# Patient Record
Sex: Female | Born: 1980 | Race: White | Hispanic: No | Marital: Married | Smoking: Never smoker
Health system: Southern US, Community
[De-identification: ages and names within clinical notes are randomized; demographics above are authoritative.]

## PROBLEM LIST (undated history)

## (undated) ENCOUNTER — Emergency Department: Admission: EM | Discharge status: Absent without leave | Payer: Self-pay

## (undated) DIAGNOSIS — I319 Disease of pericardium, unspecified: Secondary | ICD-10-CM

## (undated) DIAGNOSIS — R87629 Unspecified abnormal cytological findings in specimens from vagina: Secondary | ICD-10-CM

## (undated) DIAGNOSIS — I10 Essential (primary) hypertension: Secondary | ICD-10-CM

## (undated) DIAGNOSIS — K589 Irritable bowel syndrome without diarrhea: Secondary | ICD-10-CM

## (undated) DIAGNOSIS — T148XXA Other injury of unspecified body region, initial encounter: Secondary | ICD-10-CM

## (undated) HISTORY — PX: COLPOSCOPY: SHX161

## (undated) HISTORY — DX: Essential (primary) hypertension: I10

## (undated) HISTORY — DX: Other injury of unspecified body region, initial encounter: T14.8XXA

## (undated) HISTORY — DX: Unspecified abnormal cytological findings in specimens from vagina: R87.629

## (undated) HISTORY — DX: Irritable bowel syndrome, unspecified: K58.9

---
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
## 2002-03-27 HISTORY — PX: TONSILLECTOMY: SUR1361

## 2004-12-22 ENCOUNTER — Encounter: Payer: Self-pay | Admitting: Family Medicine

## 2004-12-22 LAB — CONVERTED CEMR LAB: Pap Smear: NORMAL

## 2005-12-28 ENCOUNTER — Ambulatory Visit: Payer: Self-pay | Admitting: Family Medicine

## 2006-01-15 ENCOUNTER — Encounter: Payer: Self-pay | Admitting: Family Medicine

## 2006-01-15 DIAGNOSIS — E669 Obesity, unspecified: Secondary | ICD-10-CM | POA: Insufficient documentation

## 2006-02-14 ENCOUNTER — Ambulatory Visit: Payer: Self-pay | Admitting: Family Medicine

## 2006-02-14 ENCOUNTER — Other Ambulatory Visit: Admission: RE | Admit: 2006-02-14 | Discharge: 2006-02-14 | Payer: Self-pay | Admitting: Family Medicine

## 2006-02-21 ENCOUNTER — Telehealth: Payer: Self-pay | Admitting: Family Medicine

## 2006-03-21 ENCOUNTER — Telehealth: Payer: Self-pay | Admitting: Family Medicine

## 2006-06-01 ENCOUNTER — Ambulatory Visit: Payer: Self-pay | Admitting: Family Medicine

## 2006-06-01 DIAGNOSIS — M545 Low back pain, unspecified: Secondary | ICD-10-CM | POA: Insufficient documentation

## 2006-06-04 ENCOUNTER — Ambulatory Visit: Payer: Self-pay | Admitting: Family Medicine

## 2006-06-04 DIAGNOSIS — J029 Acute pharyngitis, unspecified: Secondary | ICD-10-CM | POA: Insufficient documentation

## 2006-06-04 LAB — CONVERTED CEMR LAB: Rapid Strep: NEGATIVE

## 2006-07-12 ENCOUNTER — Ambulatory Visit: Payer: Self-pay | Admitting: Family Medicine

## 2006-07-12 DIAGNOSIS — R197 Diarrhea, unspecified: Secondary | ICD-10-CM | POA: Insufficient documentation

## 2006-08-09 ENCOUNTER — Ambulatory Visit: Payer: Self-pay | Admitting: Family Medicine

## 2006-08-10 LAB — CONVERTED CEMR LAB
ALT: 15 units/L (ref 0–35)
AST: 20 units/L (ref 0–37)
Albumin: 4.1 g/dL (ref 3.5–5.2)
Alkaline Phosphatase: 79 units/L (ref 39–117)
BUN: 11 mg/dL (ref 6–23)
CO2: 22 meq/L (ref 19–32)
Calcium: 9.2 mg/dL (ref 8.4–10.5)
Chloride: 103 meq/L (ref 96–112)
Creatinine, Ser: 0.76 mg/dL (ref 0.40–1.20)
Glucose, Bld: 86 mg/dL (ref 70–99)
HCT: 41.5 % (ref 36.0–46.0)
Hemoglobin: 13.6 g/dL (ref 12.0–15.0)
MCHC: 32.8 g/dL (ref 30.0–36.0)
MCV: 88.1 fL (ref 78.0–100.0)
Platelets: 380 10*3/uL (ref 150–400)
Potassium: 3.8 meq/L (ref 3.5–5.3)
RBC: 4.71 M/uL (ref 3.87–5.11)
RDW: 14 % (ref 11.5–14.0)
Sodium: 139 meq/L (ref 135–145)
TSH: 1.443 microintl units/mL (ref 0.350–5.50)
Total Bilirubin: 0.3 mg/dL (ref 0.3–1.2)
Total Protein: 7.7 g/dL (ref 6.0–8.3)
WBC: 6.8 10*3/uL (ref 4.0–10.5)

## 2006-08-13 ENCOUNTER — Encounter: Payer: Self-pay | Admitting: Family Medicine

## 2006-11-16 ENCOUNTER — Encounter: Payer: Self-pay | Admitting: Family Medicine

## 2006-12-14 ENCOUNTER — Encounter: Payer: Self-pay | Admitting: Family Medicine

## 2007-03-28 DIAGNOSIS — I319 Disease of pericardium, unspecified: Secondary | ICD-10-CM

## 2007-03-28 HISTORY — DX: Disease of pericardium, unspecified: I31.9

## 2007-06-07 ENCOUNTER — Ambulatory Visit: Payer: Self-pay | Admitting: Family Medicine

## 2007-06-07 DIAGNOSIS — R059 Cough, unspecified: Secondary | ICD-10-CM | POA: Insufficient documentation

## 2007-06-07 DIAGNOSIS — K219 Gastro-esophageal reflux disease without esophagitis: Secondary | ICD-10-CM | POA: Insufficient documentation

## 2007-06-07 DIAGNOSIS — R05 Cough: Secondary | ICD-10-CM

## 2010-04-26 NOTE — Letter (Signed)
Summary: Out of Work  [facility]  [address], [address]   [place], [place] [address]   Phone: [phone]  Fax: [phone]    June 04, 2006   Employee:  CHEALSEY MIYAMOTO    To Whom It May Concern:   For Medical reasons, please excuse the above named employee from work for the following dates:  Start:   06-01-06  End:   06-05-06  If you need additional information, please feel free to contact our office.         Sincerely,    Nani Gasser MD

## 2010-04-26 NOTE — Assessment & Plan Note (Signed)
Summary: STOMACH   Vital Signs:  Patient Profile:   30 Years Old Female Height:     75 inches Weight:      244 pounds Temp:     98.2 degrees F oral Pulse rate:   68 / minute BP sitting:   120 / 71  (right arm) Cuff size:   large  Vitals Entered By: Kathlene November (July 12, 2006 9:00 AM)               PCP:  Cipriano Bunker  Chief Complaint:  diarrhea started this am around 6:45. Has went to bathroom 4-5 times already this am. Denies N/V.  History of Present Illness: Diarrhea started this AM and has had loose stools 4-5 already today.  Started period in 6th grade.  Usually gets diarrhea with periods, but not with every period. Called into work today.  Lasts 5-7 days.  On OCPs, Sronyx, same one for a long time.  OCPs does help with cramping. No recent illnesses or feer.   Prior Medications: LUTERA 0.1-20 MG-MCG TABS (LEVONORGESTREL-ETHINYL ESTRAD) one by mouth daily NAPROXEN 500 MG TABS (NAPROXEN) Take 1 tablet by mouth two times a day FLEXERIL 10 MG TAB (CYCLOBENZAPRINE HCL) Take 1 tablet by mouth at bedtime SRONYX 0.1-20 MG-MCG TABS (LEVONORGESTREL-ETHINYL ESTRAD) Take 1 tablet by mouth once a day Current Allergies: No known allergies       Physical Exam  General:     Well-developed,well-nourished,in no acute distress; alert,appropriate and cooperative throughout examination    Impression & Recommendations:  Problem # 1:  DIARRHEA (ICD-787.91) Use Immodium the first day of periods to prevent the diarrea. Can also consider changing OCPs as well, though this may not help.  Please call.

## 2010-04-26 NOTE — Letter (Signed)
Summary: Out of Work  [facility]  [address]   [place], [place] [address]   Phone: [phone]  Fax: [phone]    July 12, 2006   Employee:  IRISH BREISCH    To Whom It May Concern:   For Medical reasons, please excuse the above named employee from work for the following dates:  Start:   07-12-06  End:   07-13-06  If you need additional information, please feel free to contact our office.         Sincerely,    Nani Gasser MD

## 2010-04-26 NOTE — Letter (Signed)
Summary: Out of Work  [facility]  [address]   [place], [place] [address]   Phone: [phone]  Fax: [phone]    Aug 09, 2006   Employee:  CHENEE MUNNS    To Whom It May Concern:   For Medical reasons, please excuse the above named employee from work for the following dates:  Start:   08/09/06  End:   08/13/06  If you need additional information, please feel free to contact our office.         Sincerely,    Seymour Bars D.O.

## 2010-04-26 NOTE — Assessment & Plan Note (Signed)
Summary: IBS induced diarrhea   Vital Signs:  Patient Profile:   30 Years Old Female LMP:     08/09/2006 Height:     75 inches Weight:      241 pounds Pulse rate:   73 / minute BP sitting:   128 / 85  (right arm) Cuff size:   large  Vitals Entered By: Harlene Salts (Aug 09, 2006 3:32 PM)  Menstrual History: LMP (date): 08/09/2006                Visit Type:  f/u PCP:  Cipriano Bunker  Chief Complaint:  diarrhea with menstral cycle and immodium not helping.  History of Present Illness: 30 yo WF here for diarrhea at the onset of menses.  This has occured since menarche began at age 55.  It usually starts 1 day before onset of menses and lasts a total of 2 days.  It is now occuring every cycle and is getting in the way of work.  Has some cramps.  Periods are not heavy.  On OCPs.  Has some nausea, no vomitting.  No blood in stool.  Started exercising and sticks to a bland diet.  Immodium AD not helping.  Denies IBS symptoms at other times of the month.  Not regularly taking NSAIDs.  Current Allergies: No known allergies   Past Medical History:    LBP     Review of Systems      See HPI   Physical Exam  General:     alert, well-developed, well-nourished, and well-hydrated.  obese  Mouth:     good dentition and pharynx pink and moist.   Neck:     no masses.   Lungs:     Normal respiratory effort, chest expands symmetrically. Lungs are clear to auscultation, no crackles or wheezes. Heart:     Normal rate and regular rhythm. S1 and S2 normal without gallop, murmur, click, rub or other extra sounds. Abdomen:     soft, non-tender, normal bowel sounds, no distention, no masses, no guarding, no rigidity, no rebound tenderness, no hepatomegaly, and no splenomegaly.   Extremities:     no LE edema Skin:     color normal and no rashes.   Cervical Nodes:     No lymphadenopathy noted Psych:     not anxious appearing and not depressed appearing.      Impression &  Recommendations:  Problem # 1:  DIARRHEA (ICD-787.91) By history, sounds to be IBS induced diarrhea at onset of menses.  Likley made worse by increased prostoglandins.  For treatment, begin Rx Anaprox 3 days before onset of menses.  Take twice a day with food for a total of 5 days and use Bentyl as needed for cramps and diarrhea.  Stay on daily fiber supplement, bland diet, clear liquids and Immodium if needed.  Stay on OCPs.  Get lab w/u.  See back in 2 mos. Orders: T-TSH [id number]) T-Comprehensive Metabolic Panel [phone]) T-CBC No Diff ([id number])   Medications Added to Medication List This Visit: 1)  Anaprox Ds 550 Mg Tabs (Naproxen sodium) .Marland Kitchen.. 1 tab by mouth two times a day with meals, start 3 days before period, take for 5 days 2)  Bentyl 20 Mg Tabs (Dicyclomine hcl) .Marland Kitchen.. 1 tab by mouth qid as needed diarrhea   Patient Instructions: 1)  Start Anaprox 3 days before period begins.  Take for 5 days straight.  This will help with cramps and the start of diarrhea. 2)  Use  Bentyl as needed for diarrhea and cramps at onset of period. 3)  Stay on birth control pills. 4)  Start a daily fiber supplement like Benefiber daily. 5)  Have labs drawn. 6)  Bland diet, clear fluids. 7)  F/U with Dr Judie Petit in 2 months.

## 2010-04-26 NOTE — Assessment & Plan Note (Signed)
Summary: BACK INJURY   Vital Signs:  Patient Profile:   30 Years Old Female Height:     75 inches Weight:      246 pounds Pulse rate:   85 / minute BP sitting:   143 / 98  (left arm) Cuff size:   large  Vitals Entered By: Kathlene November (June 01, 2006 2:16 PM)              Prescriptions: SRONYX 0.1-20 MG-MCG TABS (LEVONORGESTREL-ETHINYL ESTRAD) Take 1 tablet by mouth once a day  #one pack x PRN   Entered and Authorized by:   Nani Gasser MD   Signed by:   Nani Gasser MD on 06/01/2006   Method used:   Print then Give to Patient   RxID:   [id number] FLEXERIL 10 MG TAB (CYCLOBENZAPRINE HCL) Take 1 tablet by mouth at bedtime  #30 x 1   Entered and Authorized by:   Nani Gasser MD   Signed by:   Nani Gasser MD on 06/01/2006   Method used:   Print then Give to Patient   RxID:   [id number] NAPROXEN 500 MG TABS (NAPROXEN) Take 1 tablet by mouth two times a day  #60 tabs x 1   Entered and Authorized by:   Nani Gasser MD   Signed by:   Nani Gasser MD on 06/01/2006   Method used:   Print then Give to Patient   RxID:   [id number]    PCP:  Cipriano Bunker  Chief Complaint:  having some lower back pain. Had injury to it in 2001 was told muscle spasms. This episode of pain started last night.  History of Present Illness: Orginal back injury in 2001. Had back pain and fell while turning and hurt her low back.  Had muscle spasm at that time.  No disc or fracture.  This January pain so sever felt nauseated and is severe.  Hurting again starting last night.  Worse with sitting for long period.  Feel like muslce spasm.  Has had muscle relaxer and anti-inflammatories and helps after a few days.  Gained 40lbs in last 6 years. Pain more on right low back and raidates to tail bone esp when sits on the toilet.   Prior Medications: LUTERA 0.1-20 MG-MCG TABS (LEVONORGESTREL-ETHINYL ESTRAD) one by mouth daily Current Allergies: No known  allergies       Physical Exam  General:     Well-developed,well-nourished,in no acute distress; alert,appropriate and cooperative throughout examination Msk:     Tight hamstrings.   Neurologic:     Patellar, achiles, adn radial reflexes 2+ symmetric.     Detailed Back/Spine Exam  Lumbosacral Exam:  Inspection-deformity:    Normal Palpation-spinal tenderness:  Normal Range of Motion:    Forward Flexion:   80 degrees    Hyperextension:   10 degrees    Right Lateral Bend:   30 degrees    Left Lateral Bend:   30 degrees Squatting:  normal Sitting Straight Leg Raise:    Right:  positive in back only Contralateral Straight Leg Raise:    Right:  positive in back only Sciatic Notch:    There is no sciatic notch tenderness.    Impression & Recommendations:  Problem # 1:  LUMBAGO (ICD-724.2) Mes below.  Discussed weight loss and regular exercise 4x/week. to reduce freq of sxs with focus on hamstring stretches and general increase walking. Also rec evlauate work space to make sure computer at proper height, posture, etc.  Pt understood care plan.  Her updated medication list for this problem includes:    Naproxen 500 Mg Tabs (Naproxen) .Marland Kitchen... Take 1 tablet by mouth two times a day Back care instructions given. To be seen in 2 weeks if no improvement; sooner if worsening of symptoms.  Her updated medication list for this problem includes:    Naproxen 500 Mg Tabs (Naproxen) .Marland Kitchen... Take 1 tablet by mouth two times a day    Flexeril 10 Mg Tab (Cyclobenzaprine hcl) .Marland Kitchen... Take 1 tablet by mouth at bedtime   Medications Added to Medication List This Visit: 1)  Naproxen 500 Mg Tabs (Naproxen) .... Take 1 tablet by mouth two times a day 2)  Flexeril 10 Mg Tab (Cyclobenzaprine hcl) .... Take 1 tablet by mouth at bedtime 3)  Sronyx 0.1-20 Mg-mcg Tabs (Levonorgestrel-ethinyl estrad) .... Take 1 tablet by mouth once a day

## 2010-04-26 NOTE — Assessment & Plan Note (Signed)
Summary: reflux/ cough   Vital Signs:  Patient Profile:   30 Years Old Female Height:     75 inches Weight:      248.25 pounds BMI:     31.14 BSA:     2.41 Temp:     97.2 degrees F oral Pulse rate:   79 / minute Pulse rhythm:   regular Resp:     20 per minute BP sitting:   143 / 82  (right arm)  Pt. in pain?   yes    Location:   chest    Intensity:   1                  Visit Type:  acute PCP:  Nani Gasser MD  Chief Complaint:  getting over a cold. When laying down and gets a pressure in chest/neck and hurts to take deep breaths. Had diarrhea and headache today.Judy Marshall  History of Present Illness: 30 yo WF presents for acute visit.  Had a cold 3 wks ago.  Still has post-nasal drip with cough.  Last night, felt chest pressure with trouble getting a deep breathe.  Has some HA and diarrhea.  No F/C.  No heartburn.  No OTC meds.  No increase in anxiety.  No epigastric pain.  No nausea.  Eating and drinking w/o problems.  No vomitting.  2-3 loose stools a day w/o abdominal pain.  No weight loss.  normal menses.  Not late for period.    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  Married to [employer], no kids.  Some mild exercise.         Alcohol use-no       Drug use-no       Regular exercise-yes       Never Smoked    Review of Systems      See HPI   Physical Exam  General:     alert, well-developed, well-nourished, and well-hydrated.  obese Head:     normocephalic and atraumatic.   Eyes:     conjunctiva clear Ears:     EACs patent; TMs translucent and gray with good cone of light and bony landmarks.  Nose:     no external deformity and no nasal discharge.  scant clear discharge Mouth:     good dentition and pharynx pink and moist.   Neck:     supple and no masses.   Chest Wall:     mid chest wall  TTP Lungs:     Normal respiratory effort, chest expands symmetrically. Lungs are clear to auscultation, no crackles or wheezes. Heart:     Normal rate and regular rhythm. S1 and S2 normal without gallop, murmur, click, rub or other extra sounds. Abdomen:     soft, non-tender, normal bowel sounds, no distention, no masses, no guarding, no rigidity, no rebound tenderness, no hepatomegaly, and no splenomegaly.   Extremities:     no E/C/C Skin:     color normal and no rashes.   Cervical Nodes:     No lymphadenopathy noted Psych:     good eye contact, not anxious appearing, and not depressed appearing.      Impression & Recommendations:  Problem # 1:  ESOPHAGEAL REFLUX (ICD-530.81) Positional burning of the mid chest with heart palpitations and feeling  of SOB is likley to be acid reflux.  She is obese so is at risk for reflux.  Trial of Kapidex one daily x 10 days in addition to low acid diet and small meals.  Avoid late night eating.  F/u in 2 wks. Her updated medication list for this problem includes:    Bentyl 20 Mg Tabs (Dicyclomine hcl) .Judy Marshall... 1 tab by mouth qid as needed diarrhea   Problem # 2:  DIARRHEA (ICD-787.91) 2-3 loose stools per day, not likley to be viral.  Likley IBS- related.  Pt takes Bentyl as needed.  No sign of dehydration on exam today.  Problem # 3:  COUGH (ICD-786.2) Residual nighttime cough 3 wks after start of URI which has improved.  Likley postnasal drip.  Since cough is keeping her up and her husband up, will use RX cough syrup for residual cough.  No wheezing to suggest bronchospasm.  Nighttime cough may also be GERD and pt is trying Kapidex daily until f/u appt.  She may need allergy meds, nasal steroid if not improving.  Complete Medication List: 1)  Lutera 0.1-20 Mg-mcg Tabs (Levonorgestrel-ethinyl estrad) .... One by mouth daily 2)  Anaprox Ds 550 Mg Tabs (Naproxen sodium) .Judy Marshall.. 1 tab by mouth two times a day with meals, start 3 days before period, take  for 5 days 3)  Flexeril 10 Mg Tab (Cyclobenzaprine hcl) .... Take 1 tablet by mouth at bedtime 4)  Sronyx 0.1-20 Mg-mcg Tabs (Levonorgestrel-ethinyl estrad) .... Take 1 tablet by mouth once a day 5)  Bentyl 20 Mg Tabs (Dicyclomine hcl) .Judy Marshall.. 1 tab by mouth qid as needed diarrhea 6)  Tussionex Pennkinetic Er 8-10 Mg/90ml Lqcr (Chlorpheniramine-hydrocodone) .Judy Marshall.. 1 tsp by mouth at bedtime as needed cough   Patient Instructions: 1)  Start Kapidex 1 pill by mouth daily w/ breakfast for acid reflux. 2)  Use Rx cough syrup at bedtime. 3)  Call for f/u in 2 wks if not improved.    Prescriptions: TUSSIONEX PENNKINETIC ER 8-10 MG/5ML  LQCR (CHLORPHENIRAMINE-HYDROCODONE) 1 tsp by mouth at bedtime as needed cough  #100 cc x 0   Entered and Authorized by:   Seymour Bars DO   Signed by:   Seymour Bars DO on 06/07/2007   Method used:   Print then Give to Patient   RxID:   [id number]  ]

## 2010-04-26 NOTE — Letter (Signed)
Summary: Out of Work  [facility]  [address]   [place], [place] [address]   Phone: [phone]  Fax: [phone]    June 07, 2007   Employee:  Judy Marshall    To Whom It May Concern:   For Medical reasons, please excuse the above named employee from work for the following dates:  Start:   March 13th  End:   March 14th  If you need additional information, please feel free to contact our office.         Sincerely,    Seymour Bars DO

## 2010-04-26 NOTE — Assessment & Plan Note (Signed)
Summary: CPE & PAP   Vital Signs:  Patient Profile:   30 Years Old Female Height:     75 inches Weight:      242 pounds BP sitting:   142 / 78  (right arm)  Pt. in pain?   no  Vitals Entered By: Edmonia Lynch (February 14, 2006 2:00 PM)        Last PAP Result Normal PapHx  Normal (12/22/2004 9:29:02 PM)      PCP:  Cipriano Bunker  Chief Complaint:  Pap Smear.  History of Present Illness: Normal pap for 5 years, married with stable partner.  Never had abnormal pap.   Physical Exam  Genitalia:     Normal introitus for age, no external lesions, no vaginal discharge, mucosa pink and moist, no vaginal or cervical lesions, no vaginal atrophy, no friaility or hemorrhage, normal uterus size and position, no adnexal masses or tenderness   Impression & Recommendations:  Problem # 1:  SCREENING FOR MALIGNANT NEOPLASM, CERVIX (ICD-V76.2) Pap today.  F/U results.  Declined STD testing.   Orders: Est. Patient Level II (04540)   Appended Document: CPE & PAP   Tetanus/Td Immunization History:    Tetanus/Td # 1:  Historical (12/29/2005)

## 2010-04-26 NOTE — Letter (Signed)
Summary: FMLA PAPERS  FMLA PAPERS   Imported By: Harlene Salts 08/16/2006 11:13:18  _____________________________________________________________________  External Attachment:    Type:   Image     Comment:   External Document

## 2010-04-26 NOTE — Progress Notes (Signed)
  Phone Note Outgoing Call Call back at [facility] Phone [phone] Call placed by: Nani Gasser MD,  February 21, 2006 1:21 PM Reason for Call: Discuss lab or test results Details for Reason: [facility] that pap result are normal.  CM     Appended Document:     Clinical Lists Changes  Observations: Added new observation of PAP DUE: 02/2007 (02/21/2006 13:22) Added new observation of PAP SMEAR: normal (02/14/2006 13:23)      Preventive Care Screening  Pap Smear:    Date:  02/14/2006    Next Due:  02/2007    Results:  normal

## 2010-04-26 NOTE — Assessment & Plan Note (Signed)
Summary: PHaryngitis   Vital Signs:  Patient Profile:   30 Years Old Female Height:     75 inches Weight:      244 pounds Temp:     98.0 degrees F oral Pulse rate:   86 / minute BP sitting:   132 / 78  (left arm) Cuff size:   large  Vitals Entered By: Kathlene November (June 04, 2006 9:49 AM)               PCP:  Cipriano Bunker  Chief Complaint:  sore throat, ears itching and pressure, nasal and head congestion, nausea this morning with bad H/A, sx's started yest., and URI symptoms.  History of Present Illness:  URI Symptoms      This is a 30 year old woman who presents with URI symptoms.  The symptoms began yesterday ago.  The patient reports nasal congestion, sore throat, and earache.  The patient denies fever.  The patient also reports headache.  The patient denies itchy watery eyes and sneezing.  Some nausea with dry heaves.  + sick contacts.  Took Tylenol Cold.   Prior Medications: LUTERA 0.1-20 MG-MCG TABS (LEVONORGESTREL-ETHINYL ESTRAD) one by mouth daily NAPROXEN 500 MG TABS (NAPROXEN) Take 1 tablet by mouth two times a day FLEXERIL 10 MG TAB (CYCLOBENZAPRINE HCL) Take 1 tablet by mouth at bedtime SRONYX 0.1-20 MG-MCG TABS (LEVONORGESTREL-ETHINYL ESTRAD) Take 1 tablet by mouth once a day Current Allergies: No known allergies   Past Surgical History:    Hx comminutedL elbow/humerous fracture 1989    tonsillectomy 2003      Physical Exam  General:     Well-developed,well-nourished,in no acute distress; alert,appropriate and cooperative throughout examination Head:     Normocephalic and atraumatic without obvious abnormalities. No apparent alopecia or balding. Eyes:     No corneal or conjunctival inflammation noted. EOMI. Perrla.  Ears:     External ear exam shows no significant lesions or deformities.  Otoscopic examination reveals clear canals, tympanic membranes are intact bilaterally without bulging, retraction, inflammation or discharge. Hearing is grossly  normal bilaterally. Nose:     External nasal examination shows no deformity or inflammation. Nasal mucosa are pink and moist without lesions or exudates. Mouth:     Op with mild erythema.  No edema.  good dentition.      Impression & Recommendations:  Problem # 1:  PHARYNGITIS, ACUTE (ICD-462) Strep negative. Symptomatic care.  Rec Cold Eeze Zinc Lozenges.  Call if sxs worsen or not improving.  Her updated medication list for this problem includes:    Naproxen 500 Mg Tabs (Naproxen) .Marland Kitchen... Take 1 tablet by mouth two times a day  Orders: Rapid Strep (66440)    Laboratory Results   Urine Tests        Blood Tests    Date/Time Received:  06/04/06 @ 10:05am Date/Time Reported:  06/04/06 @ 10:05am  Other Tests [place]/KOH Rapid Strep: negative

## 2010-04-26 NOTE — Progress Notes (Signed)
Summary: OCPs  Phone Note Call from Patient Reason for Call: Refill Medication Summary of Call: Patient called re birth control pills. She says she asked the pharmacy to fax the request in but it is not there yet. She thinks they may have faxed it to [facility] where she transferred from. She needs this to go to the [facility] in [place]. The phone # is [phone]. Initial call taken by: Drinda Butts,  March 21, 2006 10:26 AM    Follow-up for Phone Call Details for Follow-up Action Taken: OK for refill Follow-up by: Nani Gasser MD,  March 21, 2006 11:02 AM   Prescriptions: Aldean Ast 0.1-20 MG-MCG TABS (LEVONORGESTREL-ETHINYL ESTRAD) one by mouth daily  #one pack x 5   Entered and Authorized by:   Nani Gasser MD   Signed by:   Nani Gasser MD on 03/21/2006   Method used:   Printed then faxed to ...       [facility] - [place], [place]    [place]       Ph: [phone]       Fax: [phone]   RxID:   [id number]

## 2011-01-18 DIAGNOSIS — J45909 Unspecified asthma, uncomplicated: Secondary | ICD-10-CM | POA: Insufficient documentation

## 2011-06-30 ENCOUNTER — Emergency Department
Admission: EM | Admit: 2011-06-30 | Discharge: 2011-06-30 | Disposition: A | Discharge status: Home / Self Care | Payer: BC Managed Care – PPO | Source: Home / Self Care | Attending: Emergency Medicine | Admitting: Emergency Medicine

## 2011-06-30 DIAGNOSIS — J069 Acute upper respiratory infection, unspecified: Secondary | ICD-10-CM

## 2011-06-30 HISTORY — DX: Disease of pericardium, unspecified: I31.9

## 2011-06-30 MED ORDER — AMOXICILLIN 875 MG PO TABS: 875.0000 mg | ORAL_TABLET | Freq: Two times a day (BID) | ORAL | Status: AC

## 2011-06-30 MED ORDER — PSEUDOEPHEDRINE-GUAIFENESIN ER 60-600 MG PO TB12: 1.0000 | ORAL_TABLET | Freq: Two times a day (BID) | ORAL | Status: DC

## 2011-06-30 NOTE — ED Notes (Signed)
Patient complains of dry cough and clear-thin nasal congestion starting this morning. She woke with a temperature of 99.9 and headache this morning. She took ibuprofen which did help with fever and headache. Denies any history of seasonal allergies.

## 2011-06-30 NOTE — ED Provider Notes (Signed)
History     CSN: [id number]  Arrival date & time 06/30/11  1445   First MD Initiated Contact with Patient 06/30/11 1508      Chief Complaint  Patient presents with  . Nasal Congestion    x this am  . Cough    dry x this am    (Consider location/radiation/quality/duration/timing/severity/associated sxs/prior treatment) HPI Judy Marshall is a 31 y.o. female who complains of onset of cold symptoms for 1 day.  The symptoms are constant and mild-moderate in severity.  She is using ibuprofen which is helping.  Denies seasonal allergies.  + Sick contact. No sore throat + cough No pleuritic pain No wheezing + nasal congestion + post-nasal drainage ++ sinus pain/pressure No chest congestion No itchy/red eyes No earache No hemoptysis No SOB No chills/sweats + fever No nausea No vomiting No abdominal pain No diarrhea No skin rashes No fatigue No myalgias No headache    No past medical history on file.  No past surgical history on file.  No family history on file.  History  Substance Use Topics  . Smoking status: Not on file  . Smokeless tobacco: Not on file  . Alcohol Use: Not on file    OB History    No data available      Review of Systems  All other systems reviewed and are negative.    Allergies  Review of patient's allergies indicates no known allergies.  Home Medications   Current Outpatient Rx  Name Route Sig Dispense Refill  . AMOXICILLIN 875 MG PO TABS Oral Take 1 tablet (875 mg total) by mouth 2 (two) times daily. 14 tablet 0  . PSEUDOEPHEDRINE-GUAIFENESIN ER 60-600 MG PO TB12 Oral Take 1 tablet by mouth every 12 (twelve) hours. 10 tablet 0    BP 131/83  Pulse 67  Temp(Src) 98.3 F (36.8 C) (Oral)  Resp 16  Ht 5\' 7"  (1.702 m)  Wt 260 lb (117.935 kg)  BMI 40.72 kg/m2  SpO2 100%  Physical Exam  Nursing note and vitals reviewed. Constitutional: She is oriented to person, place, and time. She appears well-developed and well-nourished.    HENT:  Head: Normocephalic and atraumatic.  Right Ear: Tympanic membrane, external ear and ear canal normal.  Left Ear: Tympanic membrane, external ear and ear canal normal.  Nose: Mucosal edema and rhinorrhea present. Right sinus exhibits maxillary sinus tenderness. Left sinus exhibits maxillary sinus tenderness.  Mouth/Throat: No oropharyngeal exudate, posterior oropharyngeal edema or posterior oropharyngeal erythema.  Eyes: No scleral icterus.  Neck: Neck supple.  Cardiovascular: Regular rhythm and normal heart sounds.   Pulmonary/Chest: Effort normal and breath sounds normal. No respiratory distress.  Neurological: She is alert and oriented to person, place, and time.  Skin: Skin is warm and dry.  Psychiatric: She has a normal mood and affect. Her speech is normal.    ED Course  Procedures (including critical care time)  Labs Reviewed - No data to display No results found.   1. Acute upper respiratory infections of unspecified site       MDM  1)  Take the prescribed antibiotic as instructed.  Most likely viral so likely best to hold antibiotics for a few days. 2)  Use nasal saline solution (over the counter) at least 3 times a day. 3)  Use over the counter decongestants like Zyrtec-D every 12 hours as needed to help with congestion.  If you have hypertension, do not take medicines with sudafed.  4)  Can take tylenol  every 6 hours or motrin every 8 hours for pain or fever. 5)  Follow up with your primary doctor if no improvement in 5-7 days, sooner if increasing pain, fever, or new symptoms.     Marlaine Hind, MD 06/30/11 [phone]

## 2011-07-11 ENCOUNTER — Emergency Department
Admission: EM | Admit: 2011-07-11 | Discharge: 2011-07-11 | Disposition: A | Discharge status: Home / Self Care | Payer: BC Managed Care – PPO | Source: Home / Self Care | Attending: Family Medicine | Admitting: Family Medicine

## 2011-07-11 ENCOUNTER — Emergency Department
Admit: 2011-07-11 | Discharge: 2011-07-11 | Disposition: A | Discharge status: Home / Self Care | Payer: BC Managed Care – PPO | Attending: Family Medicine | Admitting: Family Medicine

## 2011-07-11 ENCOUNTER — Encounter: Payer: Self-pay | Admitting: *Deleted

## 2011-07-11 DIAGNOSIS — M6789 Other specified disorders of synovium and tendon, multiple sites: Secondary | ICD-10-CM

## 2011-07-11 DIAGNOSIS — M76829 Posterior tibial tendinitis, unspecified leg: Secondary | ICD-10-CM

## 2011-07-11 NOTE — Discharge Instructions (Signed)
Apply ice pack for 20 minutes every 3 to 4 hours.  Continue for 3 to 4 days.  May also do ice massage.  Elevate leg when sitting. Obtain good quality shoe inserts with arch supports (such as "SuperFeet").  Take Ibuprofen 200mg , 4 tabs every 8 hours with food.  Begin exercises as per instruction sheet.

## 2011-07-11 NOTE — ED Provider Notes (Signed)
History     CSN: [id number]  Arrival date & time 07/11/11  1555   First MD Initiated Contact with Patient 07/11/11 1625      Chief Complaint  Patient presents with  . Foot Pain    left     HPI Comments: Patient was was standing up writing at a blackboard yesterday, bearing most of her weight on her right foot.  When she shifted weight back to her left foot, she felt sudden pain in the medial aspect of her left foot.  She continues to feel pain today, mostly in her left heel area.  The pain is improved somewhat with Ibuprofen 400mg .  No recent change in physical activities or shoes.  She states that she has had stress fracture in right metatarsals.  Patient is a 31 y.o. female presenting with ankle pain. The history is provided by the patient.  Ankle Pain This is a new problem. The current episode started yesterday. The problem occurs constantly. The problem has not changed since onset.Associated symptoms comments: None . The symptoms are aggravated by walking and standing. The symptoms are relieved by nothing. Treatments tried: Ibuprofen. The treatment provided mild relief.    Past Medical History  Diagnosis Date  . Pericarditis     2009    Past Surgical History  Procedure Date  . Left arm     Family History  Problem Relation Age of Onset  . Hypertension Mother   . Cancer Other     stomach  . Cancer Other     History  Substance Use Topics  . Smoking status: Never Smoker   . Smokeless tobacco: Never Used  . Alcohol Use: No    OB History    Grav Para Term Preterm Abortions TAB SAB Ect Mult Living                  Review of Systems  All other systems reviewed and are negative.    Allergies  Review of patient's allergies indicates no known allergies.  Home Medications   Current Outpatient Rx  Name Route Sig Dispense Refill  . AMOXICILLIN 875 MG PO TABS Oral Take 1 tablet (875 mg total) by mouth 2 (two) times daily. 14 tablet 0  . LEVONORGESTREL-ETHINYL  ESTRAD 0.1-20 MG-MCG PO TABS Oral Take 1 tablet by mouth daily.    Marland Kitchen PSEUDOEPHEDRINE-GUAIFENESIN ER 60-600 MG PO TB12 Oral Take 1 tablet by mouth every 12 (twelve) hours. 10 tablet 0    BP 131/85  Pulse 74  Resp 14  Ht 5\' 7"  (1.702 m)  Wt 259 lb (117.482 kg)  BMI 40.57 kg/m2  SpO2 100%  Physical Exam  Nursing note and vitals reviewed. Constitutional: She is oriented to person, place, and time. She appears well-developed and well-nourished. No distress.       Patient is obese (BMI 40.6)   HENT:  Head: Normocephalic.  Eyes: Conjunctivae and EOM are normal. Pupils are equal, round, and reactive to light.  Musculoskeletal:       Left ankle: She exhibits normal range of motion, no swelling, no ecchymosis, no deformity, no laceration and normal pulse. tenderness. Medial malleolus tenderness found. No AITFL, no CF ligament, no posterior TFL, no head of 5th metatarsal and no proximal fibula tenderness found. Achilles tendon normal.       Feet:       There is distinct tenderness beneath the left medial malleolus as noted on diagram.  Distal Neurovascular function is intact.   Neurological:  She is alert and oriented to person, place, and time.  Skin: Skin is warm and dry.    ED Course  Procedures none   Dg Ankle Complete Left  07/11/2011  *RADIOLOGY REPORT*  Clinical Data: Medial ankle pain beginning yesterday.  LEFT ANKLE COMPLETE - 3+ VIEW  Comparison: None.  Findings: No acute bony or joint abnormality is identified.  Soft tissues are unremarkable.  Small plantar acute spur is noted.  IMPRESSION: Negative exam.  Original Report Authenticated By: Bernadene Bell. D'ALESSIO, M.D.     1. Posterior tibial tendon dysfunction       MDM   Apply ice pack for 20 minutes every 3 to 4 hours.  Continue for 3 to 4 days.  May also do ice massage.  Elevate leg when sitting. Obtain good quality shoe inserts with arch supports (such as "SuperFeet").  Take Ibuprofen 200mg , 4 tabs every 8 hours with  food.  Begin exercises as per instruction sheet ([facility] information and instruction handout given)  Followup with [facility] if not improving about two weeks.        Lattie Haw, MD 07/11/11 [phone]

## 2011-07-11 NOTE — ED Notes (Signed)
Patient c/o left foot pain x yesterday. She was bearing weight on her right foot and when putting weight back on her left she felt severe pain. Today the pain is in her heel and is 2/10 sore. No previous fractures to the left foot.

## 2011-08-15 ENCOUNTER — Encounter: Payer: Self-pay | Admitting: *Deleted

## 2011-08-15 ENCOUNTER — Emergency Department
Admission: EM | Admit: 2011-08-15 | Discharge: 2011-08-15 | Disposition: A | Discharge status: Home / Self Care | Payer: BC Managed Care – PPO | Source: Home / Self Care | Attending: Family Medicine | Admitting: Family Medicine

## 2011-08-15 ENCOUNTER — Emergency Department
Admit: 2011-08-15 | Discharge: 2011-08-15 | Disposition: A | Discharge status: Home / Self Care | Payer: BC Managed Care – PPO | Attending: Family Medicine | Admitting: Family Medicine

## 2011-08-15 DIAGNOSIS — R51 Headache: Secondary | ICD-10-CM

## 2011-08-15 DIAGNOSIS — R110 Nausea: Secondary | ICD-10-CM

## 2011-08-15 DIAGNOSIS — M940 Chondrocostal junction syndrome [Tietze]: Secondary | ICD-10-CM

## 2011-08-15 LAB — SEDIMENTATION RATE: Sed Rate: 68 mm/hr — ABNORMAL HIGH (ref 0–22)

## 2011-08-15 NOTE — Discharge Instructions (Signed)
Begin a BRAT diet today and slowly advance as tolerated.  Recommend Ibuprofen 200mg , 4 tabs every 8 hours with food.  If symptoms become significantly worse during the night or over the weekend, proceed to the local emergency room.   B.R.A.T. Diet Your doctor has recommended the B.R.A.T. diet for you or your child until the condition improves. This is often used to help control diarrhea and vomiting symptoms. If you or your child can tolerate clear liquids, you may have:  Bananas.   Rice.   Applesauce.   Toast (and other simple starches such as crackers, potatoes, noodles).  Be sure to avoid dairy products, meats, and fatty foods until symptoms are better. Fruit juices such as apple, grape, and prune juice can make diarrhea worse. Avoid these. Continue this diet for 2 days or as instructed by your caregiver. Document Released: 03/13/2005 Document Revised: 03/02/2011 Document Reviewed: 08/30/2006 [facility] Patient Information 2012 [place], [place].

## 2011-08-15 NOTE — ED Notes (Signed)
Pt c/o HA, nausea and vomiting yesterday. She c/o HA, a little nausea, and diarrhea. Denies vomiting today. Denies fever. She has taken IBF for HA with no relief.

## 2011-08-15 NOTE — ED Provider Notes (Signed)
History     CSN: [id number]  Arrival date & time 08/15/11  1406   First MD Initiated Contact with Patient 08/15/11 1425      Chief Complaint  Patient presents with  . Emesis  . Diarrhea     HPI Comments: Patient complains of awakening 2AM yesterday feeling flushed with a frontal headache.  This was followed by nausea and about 6 episodes of vomiting.  She also developed loose stools (but not diarrhea).  She has had no more vomiting today.  She has noticed soreness in her neck today, although throat is not distinctly sore.  No respiratory symptoms.  No GU symptoms.  No abdominal pain. She also complains of a sensation of tightness in her anterior mid-chest with deep inspiration.  The pain does not radiate.  It is worse with movement, intensified by being supine and improved somewhat by leaning forward.  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  Her chest symptoms presently feel somewhat similar to that episode.  She notes that during her previous episode of pericarditis her EKG and chest X-ray were non-diagnostic  The history is provided by the patient.    Past Medical History  Diagnosis Date  . Pericarditis     2009    Past Surgical History  Procedure Date  . Left arm     Family History  Problem Relation Age of Onset  . Hypertension Mother   . Cancer Other     stomach  . Cancer Other     History  Substance Use Topics  . Smoking status: Never Smoker   . Smokeless tobacco: Never Used  . Alcohol Use: No    OB History    Grav Para Term Preterm Abortions TAB SAB Ect Mult Living                  Review of Systems ? sore throat No cough No pleuritic pain, but has tightness in her anterior chest No wheezing No nasal congestion No post-nasal drainage No sinus pain/pressure No itchy/red eyes No earache No hemoptysis No SOB ? fever, + chills + nausea + vomiting, resolved No abdominal pain No diarrhea, but loose  stools No urinary symptoms No skin rashes + fatigue No myalgias + headache Used OTC meds with partial relief of headache (ibuprofen) Allergies  Review of patient's allergies indicates no known allergies.  Home Medications   Current Outpatient Rx  Name Route Sig Dispense Refill  . LEVONORGESTREL-ETHINYL ESTRAD 0.1-20 MG-MCG PO TABS Oral Take 1 tablet by mouth daily.    Marland Kitchen PSEUDOEPHEDRINE-GUAIFENESIN ER 60-600 MG PO TB12 Oral Take 1 tablet by mouth every 12 (twelve) hours. 10 tablet 0    BP 127/85  Pulse 81  Temp(Src) 99.1 F (37.3 C) (Oral)  Resp 18  Ht 5\' 7"  (1.702 m)  Wt 260 lb 12 oz (118.275 kg)  BMI 40.84 kg/m2  SpO2 100%  LMP 08/08/2011  Physical Exam Nursing notes and Vital Signs reviewed. Appearance:  Patient appears stated age, and in no acute distress.  Patient is obese (BMI 40.9)  Eyes:  Pupils are equal, round, and reactive to light and accomodation.  Extraocular movement is intact.  Conjunctivae are not inflamed  Ears:  Canals normal.  Tympanic membranes normal.  Nose:  Mildly congested turbinates.  No sinus tenderness.   Pharynx:  Normal Neck:  Supple.  Tender shotty posterior nodes are palpated bilaterally  Lungs:  Clear to auscultation.  Breath sounds are equal.  Chest:  Distinct tenderness to palpation over the mid-sternum.  Heart:  Regular rate and rhythm without murmurs, rubs, or gallops.  Abdomen:  Nontender without masses or hepatosplenomegaly.  Bowel sounds are present.  No CVA or flank tenderness.  Extremities:  No edema.  No calf tenderness Skin:  No rash present.   ED Course  Procedures none   Labs Reviewed  POCT CBC W AUTO DIFF ([facility])   WBC 8.2; LY 27.5; MO 4.8; GR 67.7; Hgb ; Platelets   SEDIMENTATION RATE:  68   Dg Chest 2 View  08/15/2011  *RADIOLOGY REPORT*  Clinical Data: Chest pain.  CHEST - 2 VIEW  Comparison: None  Findings: The cardiac silhouette, mediastinal and hilar contours are within normal limits.  The lungs are  clear.  No pleural effusion.  The bony thorax is intact.  IMPRESSION: No acute cardiopulmonary findings.  Original Report Authenticated By: P. Loralie Champagne, M.D.     1. Nausea; suspect an early viral syndrome.  2. Headache   3. Costochondritis, acute     Although today's exam and findings suggest costochondritis, her elevated sed rate and past history of pericarditis suggest possibility of early recurrent pericarditis.   MDM   Begin a BRAT diet today and slowly advance as tolerated. Begin Ibuprofen 200mg , 4 tabs every 8 hours with food.  Will arrange a follow-up consultation appointment with cardiologist as soon as possible.  She states that she was previously treated by cardiologist Dr. Shelbie Ammons in [facility]. If symptoms become significantly worse during the night or over the weekend, proceed to the local emergency room.   Discussed sed rate results with patient by phone after she had left clinic.  She prefers appt in [place] but cannot go tomorrow.  Will attempt to arrange appt with Dr. Jens Som next week.  Advised to proceed to ER if chest symptoms worsen.        Lattie Haw, MD 08/15/11 [phone]

## 2011-08-16 ENCOUNTER — Telehealth: Payer: Self-pay | Admitting: *Deleted

## 2011-08-16 LAB — POCT CBC W AUTO DIFF (K'VILLE URGENT CARE)

## 2011-08-16 NOTE — ED Notes (Signed)
Left message for the pt to call back to give pt appt info. Judy Marshall sch'ed appt with dr Jens Som for 08/23/11 @ 230 pm @ the [place] location.

## 2011-08-22 ENCOUNTER — Emergency Department
Admission: EM | Admit: 2011-08-22 | Discharge: 2011-08-22 | Disposition: A | Discharge status: Home / Self Care | Payer: BC Managed Care – PPO | Source: Home / Self Care | Attending: Family Medicine | Admitting: Family Medicine

## 2011-08-22 ENCOUNTER — Encounter: Payer: Self-pay | Admitting: *Deleted

## 2011-08-22 DIAGNOSIS — M940 Chondrocostal junction syndrome [Tietze]: Secondary | ICD-10-CM

## 2011-08-22 DIAGNOSIS — R110 Nausea: Secondary | ICD-10-CM

## 2011-08-22 DIAGNOSIS — R51 Headache: Secondary | ICD-10-CM

## 2011-08-22 LAB — POCT CBC W AUTO DIFF (K'VILLE URGENT CARE)

## 2011-08-22 MED ORDER — PROMETHAZINE HCL 25 MG PO TABS: ORAL_TABLET | ORAL | Status: DC

## 2011-08-22 NOTE — ED Provider Notes (Signed)
History     CSN: [id number]  Arrival date & time 08/22/11  1400   First MD Initiated Contact with Patient 08/22/11 1439      Chief Complaint  Patient presents with  . Headache  . Nausea     HPI Comments: Patient reports that she continues to have intermittent headache and nausea without vomiting.  She has been taking Ibuprofen regularly and her headache and anterior chest pain have decreased significantly.  She denies vomiting.  She feels "clammy" with chills at times.  She notes that she has been diagnosed with IBS.   Family history of migraine headaches in mother and maternal grandmother.  Patient is a 31 y.o. female presenting with headaches. The history is provided by the patient.  Headache The primary symptoms include headaches and nausea. Primary symptoms do not include syncope, loss of consciousness, seizures, dizziness, visual change, paresthesias, focal weakness, loss of sensation, fever or vomiting. Episode onset: one week ago. The symptoms are improving. The neurological symptoms are diffuse.  The headache is not associated with photophobia, visual change, neck stiffness, paresthesias or weakness.  Additional symptoms do not include neck stiffness, weakness, lower back pain, leg pain, photophobia or vertigo.    Past Medical History  Diagnosis Date  . Pericarditis     2009    Past Surgical History  Procedure Date  . Left arm     Family History  Problem Relation Age of Onset  . Hypertension Mother   . Cancer Other     stomach  . Cancer Other     History  Substance Use Topics  . Smoking status: Never Smoker   . Smokeless tobacco: Never Used  . Alcohol Use: No    OB History    Grav Para Term Preterm Abortions TAB SAB Ect Mult Living                  Review of Systems  Constitutional: Negative for fever.  HENT: Negative for sore throat, rhinorrhea and neck stiffness.   Eyes: Negative.  Negative for photophobia.  Respiratory: Negative.     Cardiovascular: Negative for syncope.  Gastrointestinal: Positive for nausea. Negative for vomiting and blood in stool.  Genitourinary: Negative.   Musculoskeletal:       Anterior chest tightness  Neurological: Positive for headaches. Negative for dizziness, vertigo, focal weakness, seizures, loss of consciousness, weakness and paresthesias.    Allergies  Review of patient's allergies indicates no known allergies.  Home Medications   Current Outpatient Rx  Name Route Sig Dispense Refill  . LEVONORGESTREL-ETHINYL ESTRAD 0.1-20 MG-MCG PO TABS Oral Take 1 tablet by mouth daily.    Marland Kitchen PROMETHAZINE HCL 25 MG PO TABS  Take one tab by mouth Q4 to 6HR prn nausea 15 tablet 0  . PSEUDOEPHEDRINE-GUAIFENESIN ER 60-600 MG PO TB12 Oral Take 1 tablet by mouth every 12 (twelve) hours. 10 tablet 0    BP 125/86  Pulse 79  Temp(Src) 99.3 F (37.4 C) (Oral)  Resp 18  Ht 5\' 7"  (1.702 m)  Wt 261 lb 12 oz (118.729 kg)  BMI 41.00 kg/m2  SpO2 100%  LMP 08/08/2011  Physical Exam Nursing notes and Vital Signs reviewed. Head:  No tenderness over temporal arteries Appearance:  Patient appears in no acute distress Eyes:  Pupils are equal, round, and reactive to light and accomodation.  Extraocular movement is intact.  Conjunctivae are not inflamed.  Fundi benign  Ears:  Canals normal.  Tympanic membranes normal.  No TMJ  tenderness. Nose:   No sinus tenderness.   Pharynx:  Normal Neck:  Supple.  No adenopathy Lungs:  Clear to auscultation.  Breath sounds are equal.  Chest:  Distinct tenderness to palpation over the sternal angle.  Remainder of sternum non-tender (on previous exam last visit entire sternum was tender to palpation). Heart:  Regular rate and rhythm without murmurs, rubs, or gallops.  Abdomen:  Nontender without masses or hepatosplenomegaly.  Bowel sounds are present.  No CVA or flank tenderness.  Extremities:  No edema.  No calf tenderness Skin:  No rash present.  Neurologic:  Cranial  nerves 2 through 12 are normal.     ED Course  Procedures  none   Labs Reviewed  POCT CBC W AUTO DIFF ([facility])  WBC 6.5; LY 30.7; MO 5.2; GR 64.1; Hgb 12.3; Platelets 287   SEDIMENTATION RATE 32 CRP 2.53      1. Costochondritis improved. Note decreased sed rate 32 (previously 68).  Pericarditis less likely  2. Nausea alone   3. Headache       MDM     Rx given for Phenergan tabs for nausea. May continue Ibuprofen 200mg , 4 tabs every 8 hours with food for headache as needed.  Recommend BRAT diet until nausea improves. Followup with cardiologist tomorrow as scheduled.  If cardiac evaluation OK, and sternum tenderness does not improve in two weeks, recommend follow-up with [facility].        Lattie Haw, MD 08/23/11 [phone]

## 2011-08-22 NOTE — ED Notes (Signed)
Pt c/o nausea, clammy when nauseated, and tension HA x 1 wk. Denies fever, denies vomiting. She has an appt with Dr Jens Som tomorrow @ 2:30pm.

## 2011-08-22 NOTE — Discharge Instructions (Signed)
May continue Ibuprofen 200mg , 4 tabs every 8 hours with food for headache as needed.  Recommend BRAT diet until nausea improves.  Costochondritis Costochondritis (Tietze syndrome), or costochondral separation, is a swelling and irritation (inflammation) of the tissue (cartilage) that connects your ribs with your breastbone (sternum). It may occur on its own (spontaneously), through damage caused by an accident (trauma), or simply from coughing or minor exercise. It may take up to 6 weeks to get better and longer if you are unable to be conservative in your activities. HOME CARE INSTRUCTIONS   Avoid exhausting physical activity. Try not to strain your ribs during normal activity. This would include any activities using chest, belly (abdominal), and side muscles, especially if heavy weights are used.   Use ice for 15 to 20 minutes per hour while awake for the first 2 days. Place the ice in a plastic bag, and place a towel between the bag of ice and your skin.   Only take over-the-counter or prescription medicines for pain, discomfort, or fever as directed by your caregiver.  SEEK IMMEDIATE MEDICAL CARE IF:   Your pain increases or you are very uncomfortable.   You have a fever.   You develop difficulty with your breathing.   You cough up blood.   You develop worse chest pains, shortness of breath, sweating, or vomiting.   You develop new, unexplained problems (symptoms).  MAKE SURE YOU:   Understand these instructions.   Will watch your condition.   Will get help right away if you are not doing well or get worse.  Document Released: 12/21/2004 Document Revised: 03/02/2011 Document Reviewed: 10/30/2007 [facility] Patient Information 2012 [place], [place].

## 2011-08-23 ENCOUNTER — Ambulatory Visit (INDEPENDENT_AMBULATORY_CARE_PROVIDER_SITE_OTHER): Payer: BC Managed Care – PPO | Admitting: Cardiology

## 2011-08-23 ENCOUNTER — Encounter: Payer: Self-pay | Admitting: Cardiology

## 2011-08-23 ENCOUNTER — Telehealth: Payer: Self-pay | Admitting: *Deleted

## 2011-08-23 DIAGNOSIS — R079 Chest pain, unspecified: Secondary | ICD-10-CM | POA: Insufficient documentation

## 2011-08-23 DIAGNOSIS — K589 Irritable bowel syndrome without diarrhea: Secondary | ICD-10-CM | POA: Insufficient documentation

## 2011-08-23 LAB — SEDIMENTATION RATE: Sed Rate: 32 mm/hr — ABNORMAL HIGH (ref 0–22)

## 2011-08-23 LAB — C-REACTIVE PROTEIN: CRP: 2.53 mg/dL — ABNORMAL HIGH (ref <0.60)

## 2011-08-23 NOTE — Progress Notes (Signed)
  HPI: 31 year old female for evaluation of chest pain/costochondritis; rule out pericarditis. Note sedimentation rate on 08/22/2011 was 32. Patient states she had an episode of viral pericarditis in 2009.  She did have some difficulty covering from that. She was seen recently for her GI issues. During that evaluation she noted occasional chest tightness. The tightness is diffuse in nature. It can increase with lying flat but after several minutes it resolves. It does not radiate. She does have the sensation of dyspnea when she has the tightness. There is no nausea. She denies dyspnea on exertion, orthopnea, PND, pedal edema or syncope. She did state that when she presses on her chest her tightness worsens. There is some improvement with ibuprofen. Cardiology was asked to evaluate for question pericarditis versus costochondritis.  Current Outpatient Prescriptions  Medication Sig Dispense Refill  . ibuprofen (ADVIL,MOTRIN) 200 MG tablet Take 200 mg by mouth as needed.      Marland Kitchen levonorgestrel-ethinyl estradiol (AVIANE,ALESSE,LESSINA) 0.1-20 MG-MCG tablet Take 1 tablet by mouth daily.      . promethazine (PHENERGAN) 25 MG tablet Take one tab by mouth Q4 to 6HR prn nausea  15 tablet  0    No Known Allergies  Past Medical History  Diagnosis Date  . Pericarditis     2009  . IBS (irritable bowel syndrome)     Past Surgical History  Procedure Date  . Left arm fracture   . Tonsillectomy     History   Social History  . Marital Status: Married    Spouse Name: N/A    Number of Children: 0  . Years of Education: N/A   Occupational History  . RECEPTIONIST    Social History Main Topics  . Smoking status: Never Smoker   . Smokeless tobacco: Never Used  . Alcohol Use: No  . Drug Use: No  . Sexually Active:    Other Topics Concern  . Not on file   Social History Narrative  . No narrative on file    Family History  Problem Relation Age of Onset  . Hypertension Mother   . Cancer Other       stomach  . Cancer Other     ROS:  no fevers or chills, productive cough, hemoptysis, dysphasia, odynophagia, melena, hematochezia, dysuria, hematuria, rash, seizure activity, orthopnea, PND, pedal edema, claudication. Remaining systems are negative.  Physical Exam:  Blood pressure 126/81, pulse 70, height 5\' 7"  (1.702 m), weight 117.953 kg (260 lb 0.6 oz), last menstrual period 08/08/2011.  General:  Well developed/obese in NAD Skin warm/dry Patient not depressed No peripheral clubbing Back-normal HEENT-normal/normal eyelids Neck supple/normal carotid upstroke bilaterally; no bruits; no JVD; no thyromegaly chest - CTA/ normal expansion; patient is tender over the substernal area. CV - RRR/normal S1 and S2; no murmurs, rubs or gallops;  PMI nondisplaced Abdomen -NT/ND, no HSM, no mass, + bowel sounds, no bruit 2+ femoral pulses, no bruits Ext-no edema, chords, 2+ DP Neuro-grossly nonfocal  ECG 08/15/2011-sinus rhythm at a rate of 64. No significant ST changes.

## 2011-08-23 NOTE — ED Notes (Signed)
Left message to call back to discuss lab results. Per dr Cathren Harsh sed rate has improved since last wk, keep appt with cardiologist today, if cardiology evaluation is good and pain persist she should f/u with sports med doctor.

## 2011-08-23 NOTE — Assessment & Plan Note (Signed)
Patient symptoms seem most consistent with musculoskeletal pain. They are reproduced with palpation. Her electrocardiogram does not show changes consistent with pericarditis. Continue nonsteroidal as needed. I will plan to repeat her echocardiogram as she does have some dyspnea associated with chest tightness. We will make sure she does not have constrictive physiology or an effusion. If negative no plans for further evaluation.

## 2011-08-23 NOTE — Assessment & Plan Note (Signed)
Management per primary care. 

## 2011-08-23 NOTE — Patient Instructions (Signed)
Your physician recommends that you schedule a follow-up appointment in: AS NEEDED PENDING TEST RESULTS  Your physician has requested that you have an echocardiogram. Echocardiography is a painless test that uses sound waves to create images of your heart. It provides your doctor with information about the size and shape of your heart and how well your heart's chambers and valves are working. This procedure takes approximately one hour. There are no restrictions for this procedure.    

## 2011-08-24 ENCOUNTER — Other Ambulatory Visit (HOSPITAL_COMMUNITY): Payer: BC Managed Care – PPO

## 2011-10-08 ENCOUNTER — Encounter: Payer: Self-pay | Admitting: *Deleted

## 2011-10-08 ENCOUNTER — Emergency Department
Admission: EM | Admit: 2011-10-08 | Discharge: 2011-10-08 | Disposition: A | Discharge status: Home / Self Care | Payer: BC Managed Care – PPO | Source: Home / Self Care

## 2011-10-08 DIAGNOSIS — S339XXA Sprain of unspecified parts of lumbar spine and pelvis, initial encounter: Secondary | ICD-10-CM

## 2011-10-08 DIAGNOSIS — S39012A Strain of muscle, fascia and tendon of lower back, initial encounter: Secondary | ICD-10-CM

## 2011-10-08 MED ORDER — CYCLOBENZAPRINE HCL 10 MG PO TABS: 10.0000 mg | ORAL_TABLET | Freq: Three times a day (TID) | ORAL | Status: AC | PRN

## 2011-10-08 MED ORDER — KETOROLAC TROMETHAMINE 60 MG/2ML IM SOLN
60.0000 mg | Freq: Once | INTRAMUSCULAR | Status: AC
  Administered 2011-10-08: 60 mg via INTRAMUSCULAR

## 2011-10-08 NOTE — ED Notes (Signed)
Pt injured back in 2001 and injury has flared back up 2 days ago.  Described as stiffness, constant aching with limited movement.

## 2011-10-08 NOTE — ED Provider Notes (Signed)
History     CSN: [id number]  Arrival date & time 10/08/11  1407   First MD Initiated Contact with Patient 10/08/11 1415      Chief Complaint  Patient presents with  . Back Pain   Patient is a 32 y.o. female presenting with back pain.  Back Pain  This is a recurrent problem. Episode onset: 2-3 days  The problem occurs daily. The problem has not changed since onset.The pain is associated with no known injury. The pain is present in the lumbar spine and gluteal region. The quality of the pain is described as aching. The pain does not radiate. The pain is at a severity of 6/10. The pain is moderate. The symptoms are aggravated by bending and twisting (prolonged sitting ). Pertinent negatives include no fever, no numbness, no perianal numbness, no bladder incontinence, no paresthesias, no paresis, no tingling and no weakness. Risk factors include obesity and a sedentary lifestyle (Pt reports prior history of this in the past > 10 years. Pain is in typical distribution. ).  Noted prior his of IBS and pericarditis in the past. No CP. No abd pain.   Past Medical History  Diagnosis Date  . Pericarditis     2009  . IBS (irritable bowel syndrome)     Past Surgical History  Procedure Date  . Left arm fracture   . Tonsillectomy     Family History  Problem Relation Age of Onset  . Hypertension Mother   . Cancer Other     stomach  . Cancer Other     History  Substance Use Topics  . Smoking status: Never Smoker   . Smokeless tobacco: Never Used  . Alcohol Use: No    OB History    Grav Para Term Preterm Abortions TAB SAB Ect Mult Living                  Review of Systems  Constitutional: Negative for fever.  Genitourinary: Negative for bladder incontinence.  Musculoskeletal: Positive for back pain.  Neurological: Negative for tingling, weakness, numbness and paresthesias.    Allergies  Review of patient's allergies indicates no known allergies.  Home Medications    Current Outpatient Rx  Name Route Sig Dispense Refill  . CYCLOBENZAPRINE HCL 10 MG PO TABS Oral Take 1 tablet (10 mg total) by mouth 3 (three) times daily as needed for muscle spasms. 30 tablet 0  . IBUPROFEN 200 MG PO TABS Oral Take 200 mg by mouth as needed.    Marland Kitchen LEVONORGESTREL-ETHINYL ESTRAD 0.1-20 MG-MCG PO TABS Oral Take 1 tablet by mouth daily.    Marland Kitchen PROMETHAZINE HCL 25 MG PO TABS  Take one tab by mouth Q4 to 6HR prn nausea 15 tablet 0    BP 127/85  Pulse 76  Temp 98.1 F (36.7 C) (Oral)  Resp 18  Ht 5\' 7"  (1.702 m)  Wt 265 lb (120.203 kg)  BMI 41.50 kg/m2  SpO2 98%  Physical Exam  Constitutional: She appears well-developed and well-nourished.       Obese    HENT:  Head: Normocephalic and atraumatic.  Eyes: Conjunctivae are normal. Pupils are equal, round, and reactive to light.  Neck: Normal range of motion. Neck supple.  Cardiovascular: Normal rate and regular rhythm.   Pulmonary/Chest: Effort normal and breath sounds normal.  Abdominal: Soft.       Obese, non tender    Musculoskeletal: Normal range of motion.       + TTP in  R lumbosacral region  + R lumbosacral pain with hip flexion bilaterally  Neurological: She is alert.  Skin: Skin is warm.    ED Course  Procedures (including critical care time)  Labs Reviewed - No data to display No results found.   No diagnosis found.    MDM  Lumbosacral strain with component of muscle spasm.  RICE, NSAIDs, flexeril for spasm. Referral to PT to help with back strengthening.  Discussed weight loss.  Handout given.  Follow up as needed.     The patient and/or caregiver has been counseled thoroughly with regard to treatment plan and/or medications prescribed including dosage, schedule, interactions, rationale for use, and possible side effects and they verbalize understanding. Diagnoses and expected course of recovery discussed and will return if not improved as expected or if the condition worsens. Patient  and/or caregiver verbalized understanding.             Floydene Flock, MD 10/08/11 [phone]

## 2011-10-09 NOTE — ED Provider Notes (Signed)
Agree with exam, assessment, and plan.   Lattie Haw, MD 10/09/11 1005

## 2012-02-13 ENCOUNTER — Emergency Department
Admission: EM | Admit: 2012-02-13 | Discharge: 2012-02-13 | Disposition: A | Discharge status: Home / Self Care | Payer: Self-pay | Source: Home / Self Care

## 2012-02-13 ENCOUNTER — Encounter: Payer: Self-pay | Admitting: *Deleted

## 2012-02-13 DIAGNOSIS — R1031 Right lower quadrant pain: Secondary | ICD-10-CM

## 2012-02-13 DIAGNOSIS — R109 Unspecified abdominal pain: Secondary | ICD-10-CM

## 2012-02-13 NOTE — ED Notes (Signed)
Pt c/o diarrhea, HA and abd cramping x 3 days. Denies fever. She has taken pepto and IBF.

## 2012-02-13 NOTE — ED Provider Notes (Signed)
History     CSN: [id number]  Arrival date & time 02/13/12  1535   First MD Initiated Contact with Patient 02/13/12 1537      Chief Complaint  Patient presents with  . Abdominal Pain  . Diarrhea  . Headache    HPI Comments: Pt states that she went to dinner Friday evening and woke Saturday am with severe abdominal pain and diarrhea (NBNB).  Pt states that sxs persisted over the weekend though mildly improving.  Pt states that she has had 2-3 episodes of abdominal pain and cramping in the last 24 hours.  Pt states that abdominal pain was intially generalized and was 8/10.  Pt states that abdominal pain has improved, but now has abdominal cramping.  No fevers or chills.  No trauma.  No nauseas assd with sxs.   Patient is a 31 y.o. female presenting with abdominal pain, diarrhea, and headaches.  Abdominal Pain The primary symptoms of the illness include abdominal pain and diarrhea. The current episode started more than 2 days ago. Progression since onset: gradually improving, though with some waxing and waning episodes of intensity.  The abdominal pain is generalized (Pain was initially generalized at onset of sxs wiht pain initially being 8/10. Pain has mildly improved  over the course of illness. Now with persistent abd cramping. ).  The patient states that she believes she is currently not pregnant. Change in bowel habit: pt with baseline hx/o IBS. Pt states that this is stable without medication. Last flare was early 2013. Sxs are not similar to current presentation.  Symptoms associated with the illness do not include chills. Associated medical issues comments: IBD.  Diarrhea The primary symptoms include abdominal pain and diarrhea.  The illness does not include chills. Associated medical issues comments: IBD.  Headache The primary symptoms include headaches.  Associated medical issues comments: IBD.    Past Medical History  Diagnosis Date  . Pericarditis     2009  . IBS  (irritable bowel syndrome)     Past Surgical History  Procedure Date  . Left arm fracture   . Tonsillectomy     Family History  Problem Relation Age of Onset  . Hypertension Mother   . Cancer Other     stomach  . Cancer Other     History  Substance Use Topics  . Smoking status: Never Smoker   . Smokeless tobacco: Never Used  . Alcohol Use: No    OB History    Grav Para Term Preterm Abortions TAB SAB Ect Mult Living                  Review of Systems  Constitutional: Negative for chills.  Gastrointestinal: Positive for abdominal pain and diarrhea.  Neurological: Positive for headaches.  All other systems reviewed and are negative.    Allergies  Review of patient's allergies indicates no known allergies.  Home Medications   Current Outpatient Rx  Name  Route  Sig  Dispense  Refill  . IBUPROFEN 200 MG PO TABS   Oral   Take 200 mg by mouth as needed.         Marland Kitchen LEVONORGESTREL-ETHINYL ESTRAD 0.1-20 MG-MCG PO TABS   Oral   Take 1 tablet by mouth daily.         Marland Kitchen PROMETHAZINE HCL 25 MG PO TABS      Take one tab by mouth Q4 to 6HR prn nausea   15 tablet   0  BP 138/85  Pulse 78  Temp 99.1 F (37.3 C) (Oral)  Resp 16  Ht 5\' 7"  (1.702 m)  Wt 264 lb (119.75 kg)  BMI 41.35 kg/m2  SpO2 100%  LMP 01/23/2012  Physical Exam  Constitutional: She is oriented to person, place, and time.       Obese, NAD   HENT:  Head: Normocephalic and atraumatic.  Mouth/Throat: Oropharynx is clear and moist.  Eyes: Conjunctivae normal are normal. Pupils are equal, round, and reactive to light.  Neck: Normal range of motion. Neck supple.  Cardiovascular: Normal rate, regular rhythm and normal heart sounds.   Pulmonary/Chest: Effort normal and breath sounds normal.  Abdominal: Soft. Bowel sounds are normal.       + RLQ TTP, mild guarding.   Musculoskeletal: Normal range of motion.  Neurological: She is alert and oriented to person, place, and time.  Skin: Skin  is warm.    ED Course  Procedures (including critical care time)  Labs Reviewed - No data to display No results found.   1. Abdominal pain   2. RLQ abdominal pain       MDM  Given RLQ pain, there is much higher concern for intra-abdominal process such as appendicitis as source of pt's sxs. Gastroenteritis is also on ddx.  Discussed with pt obtaining CT Abd and Pelvis with IV and oral contrast for further evaluation.  Pt refused this today because of lack of insurance and cost concerns.  Discussed with pt risks and benefits of imaging as well as my general concerns that include complications from untreated appendicitis.  Pt expressed understanding of information.  AMA/refusal  papers signed.  Pt stated that she would go to ER if abdominal pain worsened.  Discussed red flags for evaluation.      The patient and/or caregiver has been counseled thoroughly with regard to treatment plan and/or medications prescribed including dosage, schedule, interactions, rationale for use, and possible side effects and they verbalize understanding. Diagnoses and expected course of recovery discussed and will return if not improved as expected or if the condition worsens. Patient and/or caregiver verbalized understanding.              Doree Albee, MD 02/13/12 [phone]

## 2012-02-15 ENCOUNTER — Telehealth: Payer: Self-pay | Admitting: Emergency Medicine

## 2012-03-28 ENCOUNTER — Ambulatory Visit (INDEPENDENT_AMBULATORY_CARE_PROVIDER_SITE_OTHER): Payer: BC Managed Care – PPO | Admitting: Family Medicine

## 2012-03-28 ENCOUNTER — Encounter: Payer: Self-pay | Admitting: Family Medicine

## 2012-03-28 VITALS — BP 140/83 | HR 70 | Resp 16 | Ht 65.0 in | Wt 266.0 lb

## 2012-03-28 DIAGNOSIS — Z8679 Personal history of other diseases of the circulatory system: Secondary | ICD-10-CM

## 2012-03-28 DIAGNOSIS — Z0000 Encounter for general adult medical examination without abnormal findings: Secondary | ICD-10-CM

## 2012-03-28 MED ORDER — LEVONORGESTREL-ETHINYL ESTRAD 0.1-20 MG-MCG PO TABS: 1.0000 | ORAL_TABLET | Freq: Every day | ORAL | Status: DC

## 2012-03-28 NOTE — Progress Notes (Signed)
  Subjective:     Judy Marshall is a 32 y.o. female and is here for a comprehensive physical exam. The patient reports no problems.  History   Social History  . Marital Status: Married    Spouse Name: N/A    Number of Children: 0  . Years of Education: N/A   Occupational History  . RECEPTIONIST    Social History Main Topics  . Smoking status: Never Smoker   . Smokeless tobacco: Never Used  . Alcohol Use: No  . Drug Use: No  . Sexually Active: Yes -- Female partner(s)   Other Topics Concern  . Not on file   Social History Narrative   Started exercise program.   Health Maintenance  Topic Date Due  . Pap Smear  03/28/2014  . Tetanus/tdap  12/30/2015  . Influenza Vaccine  11/26/2011   Past Medical History  Diagnosis Date  . Pericarditis 2009  . IBS (irritable bowel syndrome)     The following portions of the patient's history were reviewed and updated as appropriate: allergies, current medications, past family history, past medical history, past social history, past surgical history and problem list.  Review of Systems A comprehensive review of systems was negative.   Objective:    BP 140/83  Pulse 70  Resp 16  Ht 5\' 5"  (1.651 m)  Wt 266 lb (120.657 kg)  BMI 44.26 kg/m2  SpO2 100%  LMP 03/13/2012 General appearance: alert, cooperative and appears stated age Head: Normocephalic, without obvious abnormality, atraumatic Eyes: conj clear, EOMi, PEERLA Ears: normal TM's and external ear canals both ears Nose: Nares normal. Septum midline. Mucosa normal. No drainage or sinus tenderness. Throat: lips, mucosa, and tongue normal; teeth and gums normal Neck: no adenopathy, no carotid bruit, no JVD, supple, symmetrical, trachea midline and thyroid not enlarged, symmetric, no tenderness/mass/nodules Back: symmetric, no curvature. ROM normal. No CVA tenderness. Lungs: clear to auscultation bilaterally Breasts: normal appearance, no masses or tenderness Heart: regular  rate and rhythm, S1, S2 normal, no murmur, click, rub or gallop Abdomen: soft, non-tender; bowel sounds normal; no masses,  no organomegaly Extremities: extremities normal, atraumatic, no cyanosis or edema Pulses: 2+ and symmetric Skin: Skin color, texture, turgor normal. No rashes or lesions Lymph nodes: Cervical, supraclavicular, and axillary nodes normal. Neurologic: Alert and oriented X 3, normal strength and tone. Normal symmetric reflexes. Normal coordination and gait    Assessment:    Healthy female exam.     Plan:     See After Visit Summary for Counseling Recommendations  Keep up a regular exercise program and make sure you are eating a healthy diet Try to eat 4 servings of dairy a day, or if you are lactose intolerant take a calcium with vitamin D daily.  Your vaccines are up to date.   Refill her birth control for one more year. She'll be due for Pap smear next year. She will have a biometrics screening done at work in March. I encouraged her to fax a copy of this to me or she can go for labs. I went ahead and gave her a lab slip today just in case.

## 2012-03-28 NOTE — Patient Instructions (Addendum)
Keep up a regular exercise program and make sure you are eating a healthy diet Try to eat 4 servings of dairy a day, or if you are lactose intolerant take a calcium with vitamin D daily.  Your vaccines are up to date.  DASH Diet The DASH diet stands for "Dietary Approaches to Stop Hypertension." It is a healthy eating plan that has been shown to reduce high blood pressure (hypertension) in as little as 14 days, while also possibly providing other significant health benefits. These other health benefits include reducing the risk of breast cancer after menopause and reducing the risk of type 2 diabetes, heart disease, colon cancer, and stroke. Health benefits also include weight loss and slowing kidney failure in patients with chronic kidney disease.   DIET GUIDELINES  Limit salt (sodium). Your diet should contain less than 1500 mg of sodium daily.   Limit refined or processed carbohydrates. Your diet should include mostly whole grains. Desserts and added sugars should be used sparingly.   Include small amounts of heart-healthy fats. These types of fats include nuts, oils, and tub margarine. Limit saturated and trans fats. These fats have been shown to be harmful in the body.  CHOOSING FOODS   The following food groups are based on a 2000 calorie diet. See your Registered Dietitian for individual calorie needs. Grains and Grain Products (6 to 8 servings daily)  Eat More Often: Whole-wheat bread, brown rice, whole-grain or wheat pasta, quinoa, popcorn without added fat or salt (air popped).   Eat Less Often: White bread, white pasta, white rice, cornbread.  Vegetables (4 to 5 servings daily)  Eat More Often: Fresh, frozen, and canned vegetables. Vegetables may be raw, steamed, roasted, or grilled with a minimal amount of fat.   Eat Less Often/Avoid: Creamed or fried vegetables. Vegetables in a cheese sauce.  Fruit (4 to 5 servings daily)  Eat More Often: All fresh, canned (in natural juice),  or frozen fruits. Dried fruits without added sugar. One hundred percent fruit juice ( cup [237 mL] daily).   Eat Less Often: Dried fruits with added sugar. Canned fruit in light or heavy syrup.  [employer], Fish, and Poultry (2 servings or less daily. One serving is 3 to 4 oz [85-114 g]).  Eat More Often: Ninety percent or leaner ground beef, tenderloin, sirloin. Round cuts of beef, chicken breast, [place] breast. All fish. Grill, bake, or broil your meat. Nothing should be fried.   Eat Less Often/Avoid: Fatty cuts of meat, [place], or chicken leg, thigh, or wing. Fried cuts of meat or fish.  Dairy (2 to 3 servings)  Eat More Often: Low-fat or fat-free milk, low-fat plain or light yogurt, reduced-fat or part-skim cheese.   Eat Less Often/Avoid: Milk (whole, 2%). Whole milk yogurt. Full-fat cheeses.  Nuts, Seeds, and Legumes (4 to 5 servings per week)  Eat More Often: All without added salt.   Eat Less Often/Avoid: Salted nuts and seeds, canned beans with added salt.  Fats and Sweets (limited)  Eat More Often: Vegetable oils, tub margarines without trans fats, sugar-free gelatin. Mayonnaise and salad dressings.   Eat Less Often/Avoid: Coconut oils, palm oils, butter, stick margarine, cream, half and half, cookies, candy, pie.  FOR MORE INFORMATION The Dash Diet Eating Plan: www.dashdiet.org Document Released: 03/02/2011 Document Revised: 06/05/2011 Document Reviewed: 03/02/2011 [facility] Patient Information 2013 [place], [place].

## 2012-04-25 ENCOUNTER — Ambulatory Visit: Payer: BC Managed Care – PPO | Admitting: Family Medicine

## 2012-05-01 ENCOUNTER — Encounter: Payer: Self-pay | Admitting: Physician Assistant

## 2012-05-01 ENCOUNTER — Ambulatory Visit (INDEPENDENT_AMBULATORY_CARE_PROVIDER_SITE_OTHER): Payer: BC Managed Care – PPO | Admitting: Physician Assistant

## 2012-05-01 VITALS — BP 151/96 | HR 76 | Temp 98.3°F | Wt 263.0 lb

## 2012-05-01 DIAGNOSIS — J029 Acute pharyngitis, unspecified: Secondary | ICD-10-CM

## 2012-05-01 LAB — POCT RAPID STREP A (OFFICE): Rapid Strep A Screen: NEGATIVE

## 2012-05-01 NOTE — Progress Notes (Signed)
  Subjective:    Patient ID: Judy Marshall, female    DOB: [birth year], 32 y.o.   MRN: [id number]  Sore Throat  This is a new problem. Episode onset: 2 days of sore throat. The problem has been gradually worsening. Sore throat worse side: both sides hurt. Maximum temperature: 99.4. The fever has been present for 1 to 2 days. The pain is at a severity of 8/10. The pain is moderate. Associated symptoms include congestion, coughing, a plugged ear sensation, neck pain, swollen glands and trouble swallowing. Pertinent negatives include no abdominal pain, diarrhea, drooling, ear discharge, ear pain, headaches, hoarse voice, shortness of breath, stridor or vomiting. She has had no exposure to strep or mono. She has tried NSAIDs and cool liquids for the symptoms. The treatment provided mild relief.      Review of Systems  HENT: Positive for congestion, trouble swallowing and neck pain. Negative for ear pain, hoarse voice, drooling and ear discharge.   Respiratory: Positive for cough. Negative for shortness of breath and stridor.   Gastrointestinal: Negative for vomiting, abdominal pain and diarrhea.  Neurological: Negative for headaches.       Objective:   Physical Exam  Constitutional: She is oriented to person, place, and time. She appears well-developed and well-nourished.       Obese.  HENT:  Head: Normocephalic and atraumatic.  Right Ear: External ear normal.  Left Ear: External ear normal.  Nose: Nose normal.       TM's clear bilaterally.   Oropharynx somewhat erythematous. No tonsils removed. No abscess or anything structural that could cause swallowing difficulties.  Eyes: Conjunctivae normal are normal.  Neck: Normal range of motion. Neck supple.       Superficial cervical nodes are enlarged bilaterally.   Cardiovascular: Normal rate, regular rhythm and normal heart sounds.   Pulmonary/Chest: Effort normal and breath sounds normal. She has no wheezes.  Lymphadenopathy:    She  has cervical adenopathy.  Neurological: She is alert and oriented to person, place, and time.  Skin: Skin is warm and dry.  Psychiatric: She has a normal mood and affect. Her behavior is normal.          Assessment & Plan:  Acute pharyngitis- Rapid strep negative. Pt given reassurance that this is viral. Pt encouraged to take motrin for pain. Gave symptomatic care in handout form. Call if not improving.

## 2012-05-01 NOTE — Patient Instructions (Addendum)
Mucinex d twice a day. Ibuprofen best for pain up to 800mg . Take around the clock.   Viral Pharyngitis Viral pharyngitis is a viral infection that produces redness, pain, and swelling (inflammation) of the throat. It can spread from person to person (contagious). CAUSES Viral pharyngitis is caused by inhaling a large amount of certain germs called viruses. Many different viruses cause viral pharyngitis. SYMPTOMS Symptoms of viral pharyngitis include:  Sore throat.  Tiredness.  Stuffy nose.  Low-grade fever.  Congestion.  Cough. TREATMENT Treatment includes rest, drinking plenty of fluids, and the use of over-the-counter medication (approved by your caregiver). HOME CARE INSTRUCTIONS   Drink enough fluids to keep your urine clear or pale yellow.  Eat soft, cold foods such as ice cream, frozen ice pops, or gelatin dessert.  Gargle with warm salt water (1 tsp salt per 1 qt of water).  If over age 6, throat lozenges may be used safely.  Only take over-the-counter or prescription medicines for pain, discomfort, or fever as directed by your caregiver. Do not take aspirin. To help prevent spreading viral pharyngitis to others, avoid:  Mouth-to-mouth contact with others.  Sharing utensils for eating and drinking.  Coughing around others. SEEK MEDICAL CARE IF:   You are better in a few days, then become worse.  You have a fever or pain not helped by pain medicines.  There are any other changes that concern you. Document Released: 12/21/2004 Document Revised: 06/05/2011 Document Reviewed: 05/19/2010 [facility] Patient Information 2013 [place], [place].

## 2012-05-03 ENCOUNTER — Encounter: Payer: Self-pay | Admitting: Family Medicine

## 2012-05-03 ENCOUNTER — Ambulatory Visit (INDEPENDENT_AMBULATORY_CARE_PROVIDER_SITE_OTHER): Payer: BC Managed Care – PPO | Admitting: Family Medicine

## 2012-05-03 VITALS — BP 139/95 | HR 91 | Temp 98.1°F

## 2012-05-03 DIAGNOSIS — J029 Acute pharyngitis, unspecified: Secondary | ICD-10-CM

## 2012-05-03 MED ORDER — AMOXICILLIN 400 MG/5ML PO SUSR: ORAL | Status: DC

## 2012-05-03 MED ORDER — PREDNISONE 20 MG PO TABS: ORAL_TABLET | ORAL | Status: AC

## 2012-05-03 NOTE — Progress Notes (Signed)
CC: Judy Marshall is a 32 y.o. female is here for Sore Throat, Cough and Otalgia   Subjective: HPI:  Patient reports sore throat. This started little less than a week ago and has been getting worse on a daily basis. Described as moderate to severe in severity. Present all hours of the day. Worse with swallowing, improved only slightly without swallowing. No pain with movement of neck. Ibuprofen slightly makes it better. Nothing else makes better or worse. It is described as a sharp sensation upon swallowing. It is nonradiating. She had subjective fevers and chills today. She denies chest pain, cough, shortness of breath, choking, facial pain, confusion, motor sensory disturbances, nor swelling of the neck.   Review Of Systems Outlined In HPI  Past Medical History  Diagnosis Date  . Pericarditis 2009  . IBS (irritable bowel syndrome)      Family History  Problem Relation Age of Onset  . Hypertension Mother   . Stomach cancer Maternal Grandmother   . Cancer Maternal Grandfather   . Stroke Paternal Grandfather      History  Substance Use Topics  . Smoking status: Never Smoker   . Smokeless tobacco: Never Used  . Alcohol Use: No     Objective: Filed Vitals:   05/03/12 0823  BP: 139/95  Pulse: 91  Temp: 98.1 F (36.7 C)    General: Alert and Oriented, No Acute Distress HEENT: Pupils equal, round, reactive to light. Conjunctivae clear.  External ears unremarkable, canals clear with intact TMs with appropriate landmarks.  Middle ear appears open without effusion. Pink inferior turbinates.  Moist mucous membranes, pharynx with mild inflammation/erythema but no other lesions or abnormalities. uvula is midline.  Neck supple without palpable lymphadenopathy nor abnormal masses. Lungs: Clear to auscultation bilaterally, no wheezing/ronchi/rales.  Comfortable work of breathing. Good air movement. Cardiac: Regular rate and rhythm. Normal S1/S2.  No murmurs, rubs, nor gallops.    Extremities: No peripheral edema.  Strong peripheral pulses.  Mental Status: No depression, anxiety, nor agitation. Skin: Warm and dry.  Assessment & Plan: Judy Marshall was seen today for sore throat, cough and otalgia.  Diagnoses and associated orders for this visit:  Pharyngitis - amoxicillin (AMOXIL) 400 MG/5ML suspension; 6mL every 8 hours for ten days. - predniSONE (DELTASONE) 20 MG tablet; Three tabs at once daily for five days.    Given inability to tolerate conservative therapy we'll start prednisone and amoxicillin to cover any potential streptococcal involvement.Signs and symptoms requring emergent/urgent reevaluation were discussed with the patient.  Return if symptoms worsen or fail to improve.

## 2012-05-10 ENCOUNTER — Encounter: Payer: Self-pay | Admitting: *Deleted

## 2012-06-29 IMAGING — CR DG CHEST 2V
2 series · 2 of 2 positions shown · non-contrast
Comparison: None

CLINICAL DATA: Chest pain.

CHEST - 2 VIEW

[view not recorded (1 of 2)]
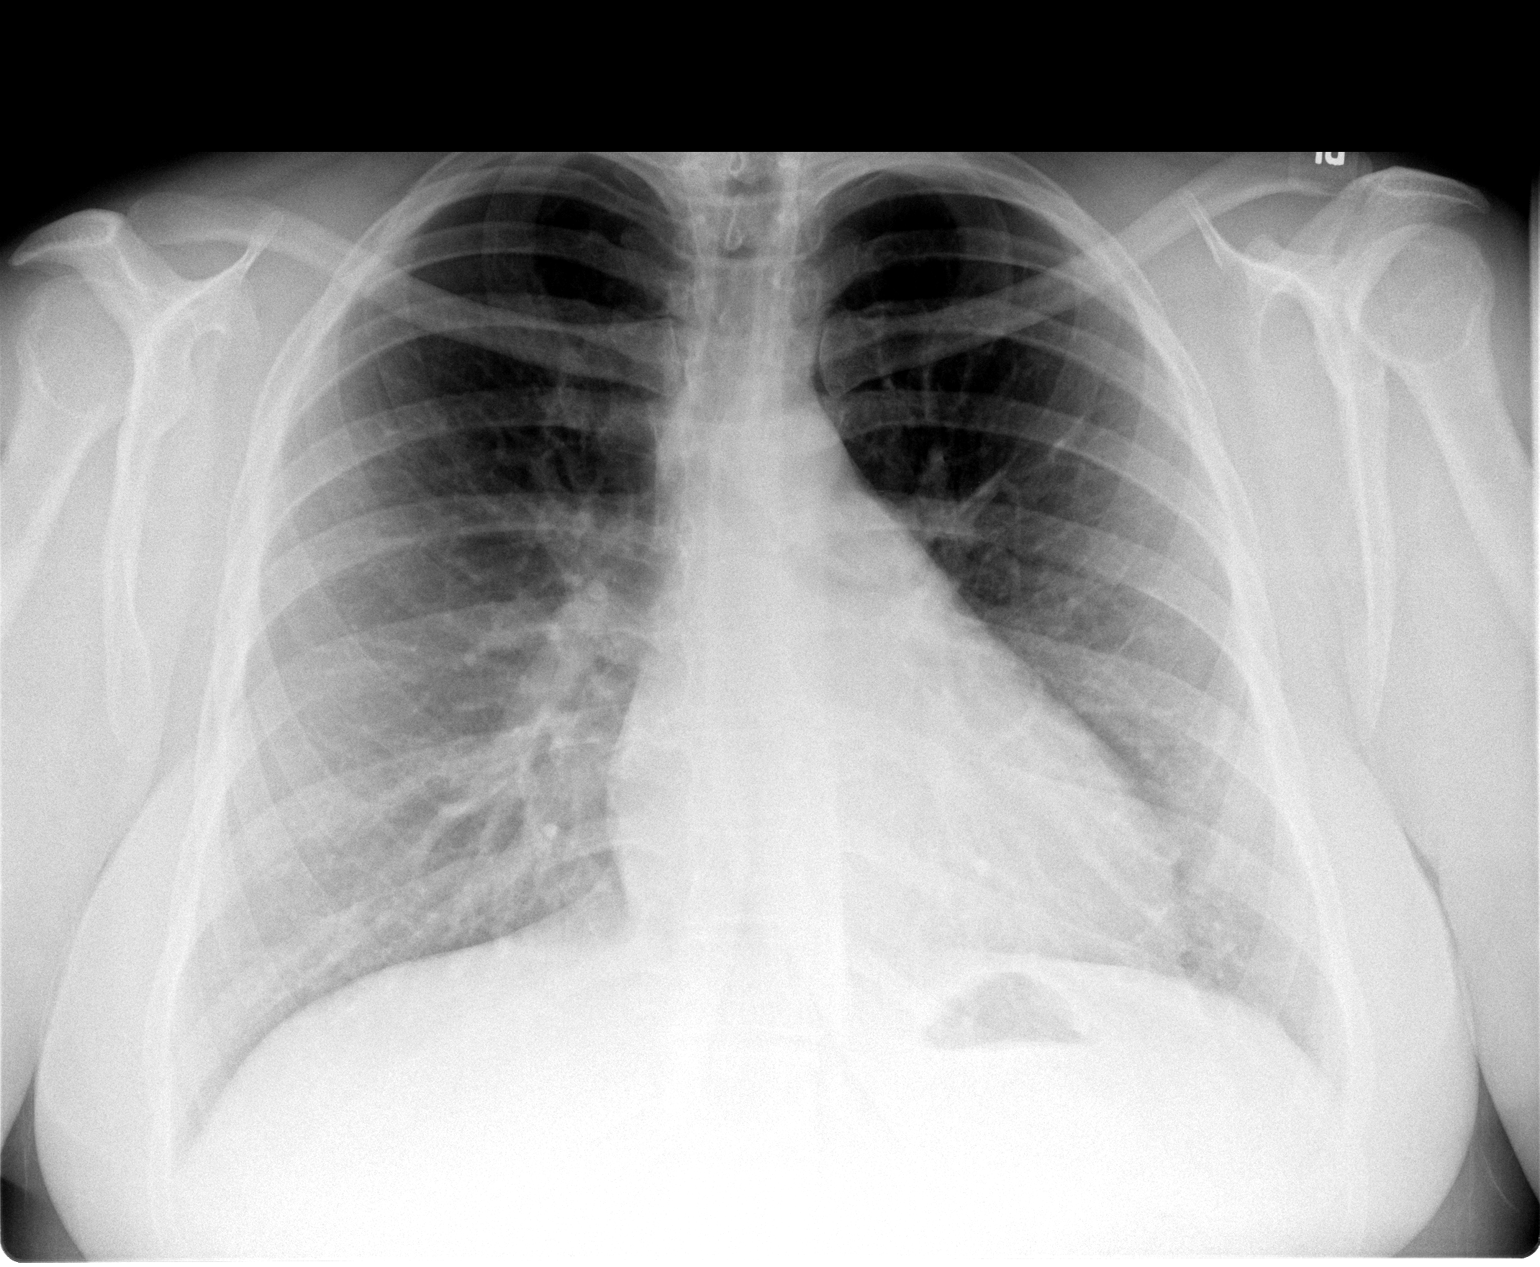

[view not recorded (2 of 2)]
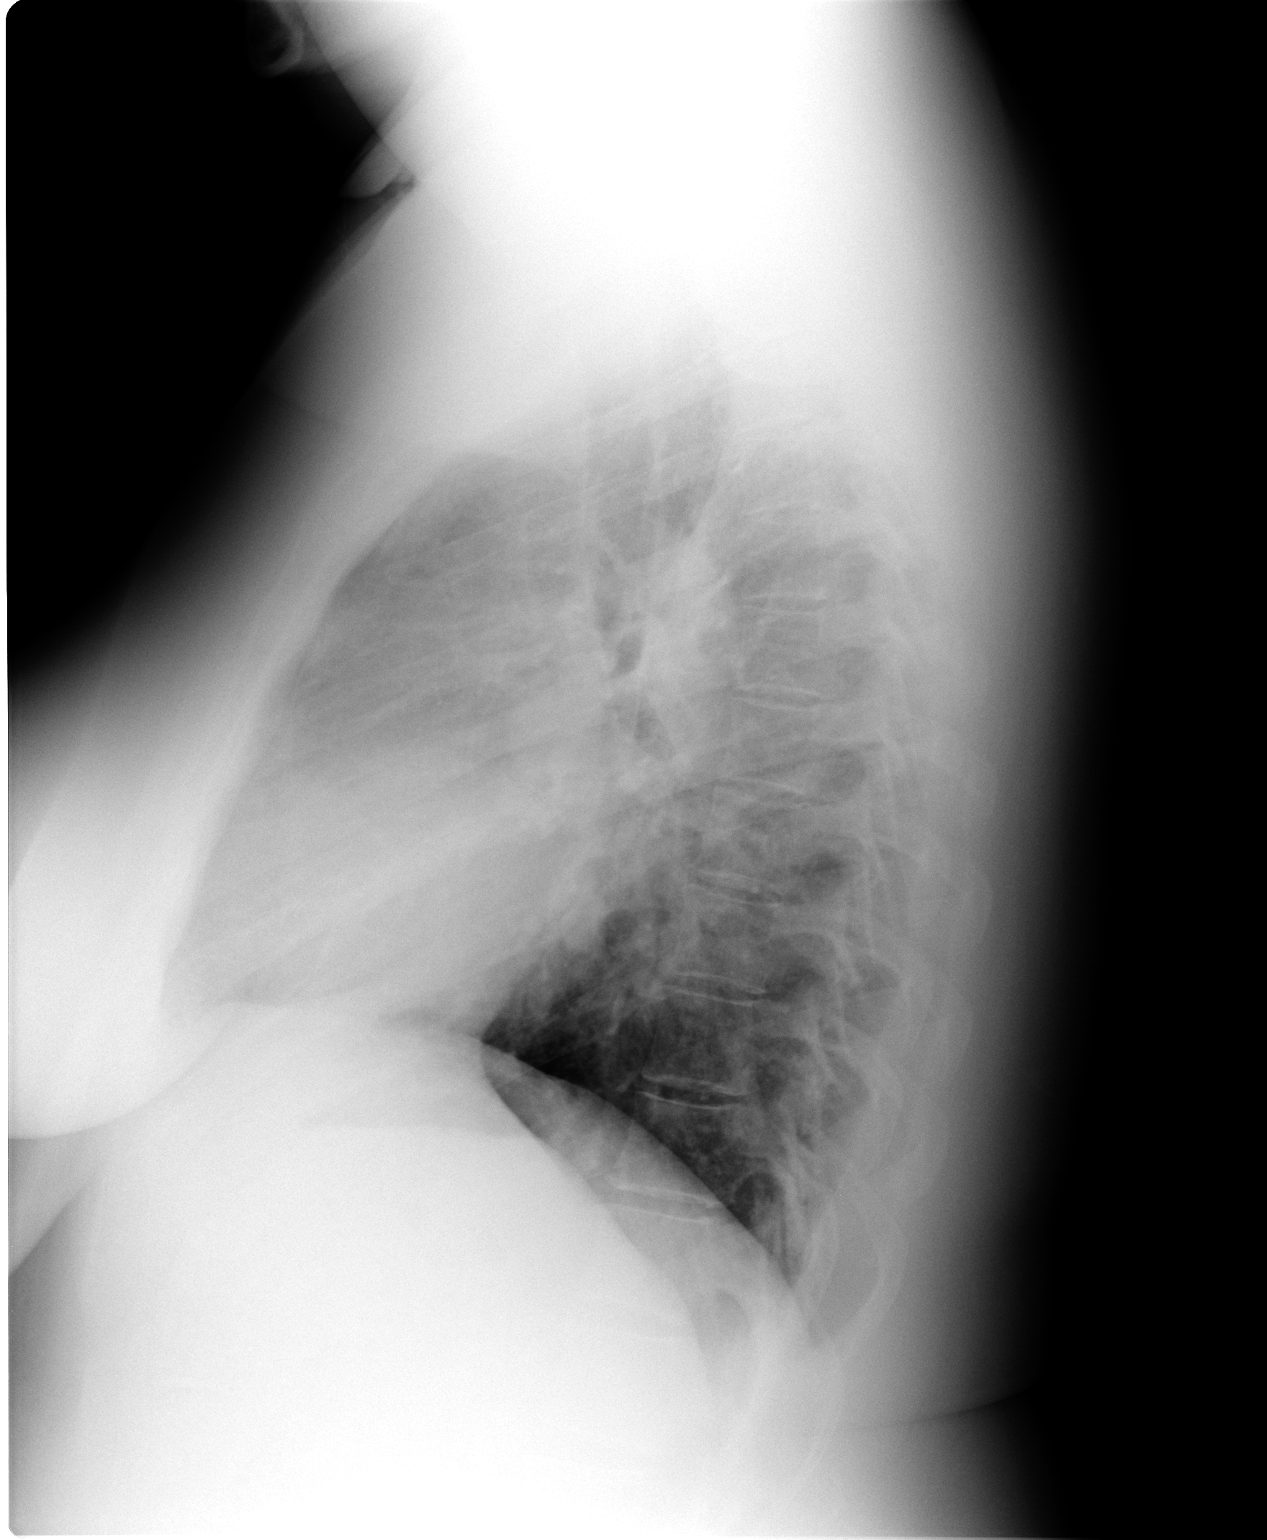

[2 of 2 positions shown; findings below may reference images not displayed]

FINDINGS: The cardiac silhouette, mediastinal and hilar contours
are within normal limits.  The lungs are clear.  No pleural
effusion.  The bony thorax is intact.
IMPRESSION: No acute cardiopulmonary findings.

## 2012-07-04 ENCOUNTER — Ambulatory Visit (INDEPENDENT_AMBULATORY_CARE_PROVIDER_SITE_OTHER): Payer: BC Managed Care – PPO

## 2012-07-04 ENCOUNTER — Ambulatory Visit (INDEPENDENT_AMBULATORY_CARE_PROVIDER_SITE_OTHER): Payer: BC Managed Care – PPO | Admitting: Physician Assistant

## 2012-07-04 ENCOUNTER — Encounter: Payer: Self-pay | Admitting: Physician Assistant

## 2012-07-04 VITALS — BP 133/81 | HR 71 | Ht 67.0 in | Wt 264.0 lb

## 2012-07-04 DIAGNOSIS — S335XXS Sprain of ligaments of lumbar spine, sequela: Secondary | ICD-10-CM

## 2012-07-04 DIAGNOSIS — M25549 Pain in joints of unspecified hand: Secondary | ICD-10-CM

## 2012-07-04 DIAGNOSIS — M545 Low back pain, unspecified: Secondary | ICD-10-CM

## 2012-07-04 DIAGNOSIS — R229 Localized swelling, mass and lump, unspecified: Secondary | ICD-10-CM

## 2012-07-04 DIAGNOSIS — M25541 Pain in joints of right hand: Secondary | ICD-10-CM

## 2012-07-04 DIAGNOSIS — S39012S Strain of muscle, fascia and tendon of lower back, sequela: Secondary | ICD-10-CM

## 2012-07-04 MED ORDER — KETOROLAC TROMETHAMINE 60 MG/2ML IM SOLN
60.0000 mg | Freq: Once | INTRAMUSCULAR | Status: AC
  Administered 2012-07-04: 60 mg via INTRAMUSCULAR

## 2012-07-04 MED ORDER — MELOXICAM 7.5 MG PO TABS: ORAL_TABLET | ORAL | Status: DC

## 2012-07-04 MED ORDER — ORPHENADRINE CITRATE ER 100 MG PO TB12: 100.0000 mg | ORAL_TABLET | Freq: Two times a day (BID) | ORAL | Status: DC

## 2012-07-04 NOTE — Progress Notes (Signed)
  Subjective:    Patient ID: Judy Marshall, female    DOB: 16-Nov-1980, 32 y.o.   MRN: [id number]  HPI Patient presents to the clinic to have FMLA paperwork filled out to occasionally miss work for back spasms. Patient injured her back in 2001 after twisted the wrong way. Dx was muscular but pt was out of school for a week and ever since then has had episodes of exacerbation. When she has exacerbation muscle relaxers and rest help the most. She is not able to work with muscle relaxer because they make her so sleepy.   Last episode was last july until Sunday night,4 days ago, she twisted the wrong way and low back went into a spasm. She took old muscle relaxer(flexeril) from urgent care and could not go to work on Monday. She went to work on Tuesday and wendnesday but last night retweaked it. She just took ibuprofen since muscle relaxer were gone. Pain is worse with movement 8/10. Located mostly right lower back today but can be on the left lower back also. Denies any radiation down legs. Tried hot baths and epson salt. 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   Pt also has a nodule on her middle finger of right hand. It has been there for over a year. It doesn't normally bother her but she went bowling and it was very aggravating. Not tried anything to make better. Doesn't seem to be getting bigger.     Review of Systems     Objective:   Physical Exam  Constitutional: She is oriented to person, place, and time. She appears well-developed and well-nourished.  Obese.  HENT:  Head: Normocephalic and atraumatic.  Cardiovascular: Normal rate, regular rhythm and normal heart sounds.   Pulmonary/Chest: Effort normal and breath sounds normal.  Musculoskeletal:  ROM decreased at the waist in all directions due to pain. paraspinus muscles are tight bilateral lower back. No spinal tenderness. Pain with palpation over right lower back.   Palpable 1mm nodule on right middle finger  PIP joint. Not tender to touch. Mobile not fixed.  Neurological: She is alert and oriented to person, place, and time.  Skin: Skin is warm and dry.  Psychiatric: She has a normal mood and affect. Her behavior is normal.          Assessment & Plan:  Low back strain, recurrent- I did fill out paperwork for patient to have approximately 1 day a month for exacerbation. Discussed with pt not to abuse. Gave norflex to see if helps but not as sedating. Gave shot of toradol 60mg  today. Instead of ibuprofen gave mobic to try with exacerbations. Gave handout with regular stretches discussed alternating heat and ice. Consider evaluation from chiorpracter. Follow up as needed.    Right hand, middle finger PIP joint, nodule, pain- will get xray to confirm nothing with the bone is going on. Suspect cyst. Discuss NSAIDs would help. If continuing to bother could send to Dr. Karie Schwalbe for consult and possible injection.   Spent 30 minutes with patient and greater than 50 percent of visit spent counseling patient regarding treatment and management of low back strain.

## 2012-07-04 NOTE — Patient Instructions (Addendum)

## 2012-07-06 DIAGNOSIS — S335XXA Sprain of ligaments of lumbar spine, initial encounter: Secondary | ICD-10-CM | POA: Insufficient documentation

## 2012-07-10 ENCOUNTER — Telehealth: Payer: Self-pay | Admitting: *Deleted

## 2012-07-10 MED ORDER — FLUCONAZOLE 150 MG PO TABS: 150.0000 mg | ORAL_TABLET | Freq: Once | ORAL | Status: DC

## 2012-07-10 NOTE — Telephone Encounter (Signed)
Patient calls and states that she seen you last Thursday and you gave her meds and she is doing well however she has a yeast infection and has used OTC treatment now for 3 days but this doesn't seem to take it completely away. Going out of town with husband and would like to get a Diflucan sent to her pharmacy. Barry Dienes, LPN

## 2012-07-10 NOTE — Telephone Encounter (Signed)
Pt notified. Rosy Callahan Wild, LPN  

## 2012-07-10 NOTE — Telephone Encounter (Signed)
Sent to pharm. May decrease effectiviness of OCP. Use condomns for next couple of days.

## 2012-07-25 ENCOUNTER — Encounter: Payer: Self-pay | Admitting: Sports Medicine

## 2012-07-25 ENCOUNTER — Ambulatory Visit (INDEPENDENT_AMBULATORY_CARE_PROVIDER_SITE_OTHER): Payer: BC Managed Care – PPO | Admitting: Sports Medicine

## 2012-07-25 VITALS — BP 137/83 | HR 68 | Wt 261.0 lb

## 2012-07-25 DIAGNOSIS — R197 Diarrhea, unspecified: Secondary | ICD-10-CM

## 2012-07-25 MED ORDER — DIPHENOXYLATE-ATROPINE 2.5-0.025 MG PO TABS: ORAL_TABLET | ORAL | Status: DC

## 2012-07-25 NOTE — Assessment & Plan Note (Signed)
I do suspect this is viral mediated. Lomotil. Considering the tenderness of her belly, I am going to check some blood work. Return in one week as needed.

## 2012-07-25 NOTE — Progress Notes (Signed)
  Subjective:    CC: Diarrhea  HPI: This pleasant 32 year old female with a history of irritable bowel syndrome comes in with a one-day history of diarrhea, multiple times per day. No blood, not dark or tarry. No nausea, no vomiting. Works in the [employer], exposed to multiple sick contacts. Generalized fatigue. No other fevers or chills.  Past medical history, Surgical history, Family history not pertinant except as noted below, Social history, Allergies, and medications have been entered into the medical record, reviewed, and no changes needed.   Review of Systems: No fevers, chills, night sweats, weight loss, chest pain, or shortness of breath.   Objective:    General: Well Developed, well nourished, and in no acute distress.  Neuro: Alert and oriented x3, extra-ocular muscles intact, sensation grossly intact.  HEENT: Normocephalic, atraumatic, pupils equal round reactive to light, neck supple, no masses, no lymphadenopathy, thyroid nonpalpable.  Skin: Warm and dry, no rashes. Cardiac: Regular rate and rhythm, no murmurs rubs or gallops, no lower extremity edema.  Respiratory: Clear to auscultation bilaterally. Not using accessory muscles, speaking in full sentences. Abdomen: Soft, moderately tender to palpation in the epigastrium and the left lower quadrant. Normal bowel sounds, no rebound, no guarding.  Impression and Recommendations:

## 2012-07-26 LAB — URINALYSIS
Glucose, UA: NEGATIVE mg/dL
Hgb urine dipstick: NEGATIVE
Ketones, ur: NEGATIVE mg/dL
Leukocytes, UA: NEGATIVE
Nitrite: NEGATIVE
Protein, ur: NEGATIVE mg/dL
Specific Gravity, Urine: >1.03 — ABNORMAL HIGH (ref 1.005–1.030)
Urobilinogen, UA: 0.2 mg/dL (ref 0.0–1.0)
pH: 6.5 (ref 5.0–8.0)

## 2012-07-26 LAB — CBC WITH DIFFERENTIAL/PLATELET
Basophils Absolute: 0 10*3/uL (ref 0.0–0.1)
Basophils Relative: 0 % (ref 0–1)
Eosinophils Absolute: 0 10*3/uL (ref 0.0–0.7)
Eosinophils Relative: 1 % (ref 0–5)
HCT: 38.2 % (ref 36.0–46.0)
Hemoglobin: 12.8 g/dL (ref 12.0–15.0)
Lymphocytes Relative: 32 % (ref 12–46)
Lymphs Abs: 2.5 10*3/uL (ref 0.7–4.0)
MCH: 28.8 pg (ref 26.0–34.0)
MCHC: 33.5 g/dL (ref 30.0–36.0)
MCV: 86 fL (ref 78.0–100.0)
Monocytes Absolute: 0.5 10*3/uL (ref 0.1–1.0)
Monocytes Relative: 6 % (ref 3–12)
Neutro Abs: 4.8 10*3/uL (ref 1.7–7.7)
Neutrophils Relative %: 61 % (ref 43–77)
Platelets: 362 10*3/uL (ref 150–400)
RBC: 4.44 MIL/uL (ref 3.87–5.11)
RDW: 14.6 % (ref 11.5–15.5)
WBC: 7.8 10*3/uL (ref 4.0–10.5)

## 2012-07-26 LAB — COMPREHENSIVE METABOLIC PANEL
ALT: 13 U/L (ref 0–35)
AST: 13 U/L (ref 0–37)
Albumin: 3.9 g/dL (ref 3.5–5.2)
Alkaline Phosphatase: 69 U/L (ref 39–117)
BUN: 16 mg/dL (ref 6–23)
CO2: 28 mEq/L (ref 19–32)
Calcium: 9 mg/dL (ref 8.4–10.5)
Chloride: 105 mEq/L (ref 96–112)
Creat: 0.88 mg/dL (ref 0.50–1.10)
Glucose, Bld: 97 mg/dL (ref 70–99)
Potassium: 4 mEq/L (ref 3.5–5.3)
Sodium: 139 mEq/L (ref 135–145)
Total Bilirubin: 0.2 mg/dL — ABNORMAL LOW (ref 0.3–1.2)
Total Protein: 7.4 g/dL (ref 6.0–8.3)

## 2012-07-26 LAB — LIPASE: Lipase: 27 U/L (ref 0–75)

## 2012-10-08 ENCOUNTER — Other Ambulatory Visit (HOSPITAL_COMMUNITY)
Admission: RE | Admit: 2012-10-08 | Discharge: 2012-10-08 | Disposition: A | Discharge status: Home / Self Care | Payer: BC Managed Care – PPO | Source: Ambulatory Visit | Attending: Family Medicine | Admitting: Family Medicine

## 2012-10-08 ENCOUNTER — Encounter: Payer: Self-pay | Admitting: Family Medicine

## 2012-10-08 ENCOUNTER — Ambulatory Visit (INDEPENDENT_AMBULATORY_CARE_PROVIDER_SITE_OTHER): Payer: BC Managed Care – PPO

## 2012-10-08 ENCOUNTER — Ambulatory Visit (INDEPENDENT_AMBULATORY_CARE_PROVIDER_SITE_OTHER): Payer: BC Managed Care – PPO | Admitting: Family Medicine

## 2012-10-08 VITALS — BP 128/79 | HR 78 | Ht 66.0 in | Wt 260.0 lb

## 2012-10-08 DIAGNOSIS — R109 Unspecified abdominal pain: Secondary | ICD-10-CM

## 2012-10-08 DIAGNOSIS — Z124 Encounter for screening for malignant neoplasm of cervix: Secondary | ICD-10-CM

## 2012-10-08 DIAGNOSIS — R319 Hematuria, unspecified: Secondary | ICD-10-CM

## 2012-10-08 DIAGNOSIS — Z1151 Encounter for screening for human papillomavirus (HPV): Secondary | ICD-10-CM | POA: Insufficient documentation

## 2012-10-08 DIAGNOSIS — R1030 Lower abdominal pain, unspecified: Secondary | ICD-10-CM

## 2012-10-08 DIAGNOSIS — R87810 Cervical high risk human papillomavirus (HPV) DNA test positive: Secondary | ICD-10-CM | POA: Insufficient documentation

## 2012-10-08 DIAGNOSIS — R1031 Right lower quadrant pain: Secondary | ICD-10-CM

## 2012-10-08 DIAGNOSIS — Z01419 Encounter for gynecological examination (general) (routine) without abnormal findings: Secondary | ICD-10-CM | POA: Insufficient documentation

## 2012-10-08 LAB — POCT URINE PREGNANCY: Preg Test, Ur: NEGATIVE

## 2012-10-08 LAB — POCT URINALYSIS DIPSTICK
Bilirubin, UA: NEGATIVE
Glucose, UA: NEGATIVE
Ketones, UA: NEGATIVE
Leukocytes, UA: NEGATIVE
Nitrite, UA: NEGATIVE
Protein, UA: 30
Spec Grav, UA: >=1.03
Urobilinogen, UA: 0.2
pH, UA: 6.5

## 2012-10-08 NOTE — Patient Instructions (Addendum)
Go straight downstairs for CT

## 2012-10-08 NOTE — Progress Notes (Signed)
Subjective:    Patient ID: Judy Marshall, female    DOB: 1981-02-16, 32 y.o.   MRN: [id number]  HPI Suprapubic pain, like a severe crampin,  x 3 days. Says hurt so bad she could barley walk . Was hunched over.  No dysuria, hematuria or flank pain. No fever.  No N/V/D, or constipation.  Married with no new sexual partners. Last LMp 2 weeks ago.  Felt if laid on her stomach it felt better. She didn't try any meds for it. Worse with cough.  Not sure of last pap.   Review of Systems BP 128/79  Pulse 78  Ht 5\' 6"  (1.676 m)  Wt 260 lb (117.935 kg)  BMI 41.99 kg/m2  LMP 09/24/2012    No Known Allergies  Past Medical History  Diagnosis Date  . Pericarditis 2009  . IBS (irritable bowel syndrome)     Past Surgical History  Procedure Laterality Date  . Left arm fracture  1989  . Tonsillectomy  2004    History   Social History  . Marital Status: Married    Spouse Name: Holley Dexter    Number of Children: 0  . Years of Education: N/A   Occupational History  . 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     Social History Main Topics  . Smoking status: Never Smoker   . Smokeless tobacco: Never Used  . Alcohol Use: No  . Drug Use: No  . Sexually Active: Yes -- Female partner(s)   Other Topics Concern  . Not on file   Social History Narrative   Started exercise program.    Family History  Problem Relation Age of Onset  . Hypertension Mother   . Stomach cancer Maternal Grandmother   . Cancer Maternal Grandfather   . Stroke Paternal Grandfather     Outpatient Encounter Prescriptions as of 10/08/2012  Medication Sig Dispense Refill  . levonorgestrel-ethinyl estradiol (AVIANE,ALESSE,LESSINA) 0.1-20 MG-MCG tablet Take 1 tablet by mouth daily.  3 Package  4  . [DISCONTINUED] diphenoxylate-atropine (LOMOTIL) 2.5-0.025 MG per tablet One to 2 tablets by mouth 4 times a day as needed for diarrhea.  30 tablet  3  . [DISCONTINUED] ibuprofen (ADVIL,MOTRIN) 200 MG tablet Take 200 mg by  mouth as needed.      . [DISCONTINUED] meloxicam (MOBIC) 7.5 MG tablet Take 1-2 tablets daily for back pain.  30 tablet  1  . [DISCONTINUED] orphenadrine (NORFLEX) 100 MG tablet Take 1 tablet (100 mg total) by mouth 2 (two) times daily.  60 tablet  1   No facility-administered encounter medications on file as of 10/08/2012.          Objective:   Physical Exam  Constitutional: She is oriented to person, place, and time. She appears well-developed and well-nourished.  HENT:  Head: Normocephalic and atraumatic.  Cardiovascular: Normal rate, regular rhythm and normal heart sounds.   Pulmonary/Chest: Effort normal and breath sounds normal.  Neurological: She is alert and oriented to person, place, and time.  Skin: Skin is warm and dry.  Psychiatric: She has a normal mood and affect. Her behavior is normal.          Assessment & Plan:  Suprapubic pain - Will check UA for UTI, Pelvic exam to test for STDs. Because she is significantly tender in the right lower quadrant compared to the left lower quadrant I would like to get a CT abdomen to rule out appendicitis. Especially because her pain was so  significant it was difficult for her to walk and stand up straight when it started on Sunday. Her bowels were a little bit more loose the following day. She has not had a fever or temperature which is reassuring. We'll call her with the results once available.  She was not sure exactly when her last Pap smear was performed at [facility] so we went ahead and collect a sample. We'll contact her office to find out. If it was within the normal range then we will discard the sample and if not then we will send it to be run.

## 2012-10-09 ENCOUNTER — Other Ambulatory Visit: Payer: Self-pay | Admitting: Family Medicine

## 2012-10-09 ENCOUNTER — Telehealth: Payer: Self-pay | Admitting: *Deleted

## 2012-10-09 LAB — WET PREP, GENITAL
Trich, Wet Prep: NONE SEEN
Yeast Wet Prep HPF POC: NONE SEEN

## 2012-10-09 MED ORDER — METRONIDAZOLE 500 MG PO TABS: 500.0000 mg | ORAL_TABLET | Freq: Two times a day (BID) | ORAL | Status: DC

## 2012-10-09 NOTE — Telephone Encounter (Signed)
Prior auth obtained for CT abd/pelvis wo cm through [employer]. Berkley Harvey #[id number]

## 2012-10-10 ENCOUNTER — Encounter: Payer: Self-pay | Admitting: *Deleted

## 2012-10-10 ENCOUNTER — Encounter: Payer: Self-pay | Admitting: Family Medicine

## 2012-10-10 LAB — URINE CULTURE
Colony Count: NO GROWTH
Organism ID, Bacteria: NO GROWTH

## 2012-10-11 ENCOUNTER — Encounter: Payer: Self-pay | Admitting: *Deleted

## 2012-10-11 ENCOUNTER — Encounter: Payer: Self-pay | Admitting: Family Medicine

## 2012-10-17 ENCOUNTER — Other Ambulatory Visit: Payer: Self-pay | Admitting: Family Medicine

## 2012-10-17 ENCOUNTER — Telehealth: Payer: Self-pay

## 2012-10-17 DIAGNOSIS — R87629 Unspecified abnormal cytological findings in specimens from vagina: Secondary | ICD-10-CM

## 2012-10-17 NOTE — Telephone Encounter (Signed)
Left message for patient to give our office a call to schedule appt for referral from dr. Linford Arnold office.

## 2012-10-18 ENCOUNTER — Other Ambulatory Visit: Payer: Self-pay | Admitting: Family Medicine

## 2012-10-18 ENCOUNTER — Encounter: Payer: Self-pay | Admitting: Family Medicine

## 2012-10-18 MED ORDER — FLUCONAZOLE 150 MG PO TABS: 150.0000 mg | ORAL_TABLET | Freq: Once | ORAL | Status: DC

## 2012-11-20 ENCOUNTER — Encounter: Payer: Self-pay | Admitting: Physician Assistant

## 2012-11-20 ENCOUNTER — Ambulatory Visit (INDEPENDENT_AMBULATORY_CARE_PROVIDER_SITE_OTHER): Payer: BC Managed Care – PPO | Admitting: Physician Assistant

## 2012-11-20 VITALS — BP 126/80 | HR 71 | Wt 263.0 lb

## 2012-11-20 DIAGNOSIS — N898 Other specified noninflammatory disorders of vagina: Secondary | ICD-10-CM

## 2012-11-20 DIAGNOSIS — N760 Acute vaginitis: Secondary | ICD-10-CM

## 2012-11-20 DIAGNOSIS — N899 Noninflammatory disorder of vagina, unspecified: Secondary | ICD-10-CM

## 2012-11-20 LAB — WET PREP FOR TRICH, YEAST, CLUE
Clue Cells Wet Prep HPF POC: NONE SEEN
Trich, Wet Prep: NONE SEEN
Yeast Wet Prep HPF POC: NONE SEEN

## 2012-11-20 MED ORDER — FLUCONAZOLE 150 MG PO TABS: ORAL_TABLET | ORAL | Status: DC

## 2012-11-20 MED ORDER — METRONIDAZOLE 0.75 % VA GEL: 1.0000 | Freq: Every day | VAGINAL | Status: DC

## 2012-11-20 NOTE — Patient Instructions (Addendum)
Probiotics. Align.Bacterial Vaginosis Bacterial vaginosis (BV) is a vaginal infection where the normal balance of bacteria in the vagina is disrupted. The normal balance is then replaced by an overgrowth of certain bacteria. There are several different kinds of bacteria that can cause BV. BV is the most common vaginal infection in women of childbearing age. CAUSES   The cause of BV is not fully understood. BV develops when there is an increase or imbalance of harmful bacteria.  Some activities or behaviors can upset the normal balance of bacteria in the vagina and put women at increased risk including:  Having a new sex partner or multiple sex partners.  Douching.  Using an intrauterine device (IUD) for contraception.  It is not clear what role sexual activity plays in the development of BV. However, women that have never had sexual intercourse are rarely infected with BV. Women do not get BV from toilet seats, bedding, swimming pools or from touching objects around them.  SYMPTOMS   Grey vaginal discharge.  A fish-like odor with discharge, especially after sexual intercourse.  Itching or burning of the vagina and vulva.  Burning or pain with urination.  Some women have no signs or symptoms at all. DIAGNOSIS  Your caregiver must examine the vagina for signs of BV. Your caregiver will perform lab tests and look at the sample of vaginal fluid through a microscope. They will look for bacteria and abnormal cells (clue cells), a pH test higher than 4.5, and a positive amine test all associated with BV.  RISKS AND COMPLICATIONS   Pelvic inflammatory disease (PID).  Infections following gynecology surgery.  Developing HIV.  Developing herpes virus. TREATMENT  Sometimes BV will clear up without treatment. However, all women with symptoms of BV should be treated to avoid complications, especially if gynecology surgery is planned. Female partners generally do not need to be treated.  However, BV may spread between female sex partners so treatment is helpful in preventing a recurrence of BV.   BV may be treated with antibiotics. The antibiotics come in either pill or vaginal cream forms. Either can be used with nonpregnant or pregnant women, but the recommended dosages differ. These antibiotics are not harmful to the baby.  BV can recur after treatment. If this happens, a second round of antibiotics will often be prescribed.  Treatment is important for pregnant women. If not treated, BV can cause a premature delivery, especially for a pregnant woman who had a premature birth in the past. All pregnant women who have symptoms of BV should be checked and treated.  For chronic reoccurrence of BV, treatment with a type of prescribed gel vaginally twice a week is helpful. HOME CARE INSTRUCTIONS   Finish all medication as directed by your caregiver.  Do not have sex until treatment is completed.  Tell your sexual partner that you have a vaginal infection. They should see their caregiver and be treated if they have problems, such as a mild rash or itching.  Practice safe sex. Use condoms. Only have 1 sex partner. PREVENTION  Basic prevention steps can help reduce the risk of upsetting the natural balance of bacteria in the vagina and developing BV:  Do not have sexual intercourse (be abstinent).  Do not douche.  Use all of the medicine prescribed for treatment of BV, even if the signs and symptoms go away.  Tell your sex partner if you have BV. That way, they can be treated, if needed, to prevent reoccurrence. SEEK MEDICAL CARE IF:  Your symptoms are not improving after 3 days of treatment.  You have increased discharge, pain, or fever. MAKE SURE YOU:   Understand these instructions.  Will watch your condition.  Will get help right away if you are not doing well or get worse. FOR MORE INFORMATION  Division of STD Prevention (DSTDP), Centers for Disease Control  and Prevention: [employer] American Social Health Association (ASHA): www.ashastd.org  Document Released: 03/13/2005 Document Revised: 06/05/2011 Document Reviewed: 09/03/2008 [facility] Patient Information 2014 [place], [place].

## 2012-11-20 NOTE — Progress Notes (Signed)
  Subjective:    Patient ID: Judy Marshall, female    DOB: Feb 16, 1981, 32 y.o.   MRN: [id number]  HPI Patient is a 32 year old female who presents to the clinic with vaginal irritation. She denies any burning when she urinates. She has had a history of recurrent bacterial vaginosis infections. She also sees [facility]. She has come to the office for treatment approximately 4 times already this year for bacterial vaginosis. These infections always come around her menstrual cycle. She just finished her menstrual cycle one day ago. One month ago she was given Flagyl for BV. She was not able to tolerate Flagyl due to GI upset and then she got a yeast infection that was treated with Diflucan. She is concerned because she only received one pill of Diflucan when she normally receives 2 tabs of yeast medication. Today her symptoms still very irritated accompanied by an itch. She denies any discharge. Symptoms only been going on for one day. Patient is concerned because she leaves for vacation in the morning and she would like to have relief.   Review of Systems     Objective:   Physical Exam  Genitourinary:  Vagina appeared irritated with a white to yellow milky discharge present.          Assessment & Plan:  Vaginal irritation/vaginitis-stat wet prep was negative but did show some white blood cells. I am concerned that it was too early for diagnosis or swab was not appropriately obtain. Symptoms are consistent with BV and since patient is going on a trip I'm going to treat her with metronidazole gel with vaginal insertion instead of oral tabs. I also did send her to tabs of Diflucan if she gets yeast infection. We did have a discussion about an extended treatment for BV since she's had for many occurrences. I am willing to do that but I like to give it one more shot. Handout was given on BV. I also encouraged patient to start probiotics daily. If symptoms not improving please call office.

## 2013-01-30 ENCOUNTER — Other Ambulatory Visit: Payer: Self-pay

## 2013-02-04 ENCOUNTER — Encounter: Payer: Self-pay | Admitting: Family Medicine

## 2013-02-05 ENCOUNTER — Other Ambulatory Visit: Payer: Self-pay

## 2013-02-05 MED ORDER — LEVONORGESTREL-ETHINYL ESTRAD 0.1-20 MG-MCG PO TABS: 1.0000 | ORAL_TABLET | Freq: Every day | ORAL | Status: DC

## 2013-05-08 ENCOUNTER — Other Ambulatory Visit: Payer: Self-pay | Admitting: *Deleted

## 2013-05-08 ENCOUNTER — Encounter: Payer: Self-pay | Admitting: Family Medicine

## 2013-05-08 MED ORDER — LEVONORGESTREL-ETHINYL ESTRAD 0.1-20 MG-MCG PO TABS: 1.0000 | ORAL_TABLET | Freq: Every day | ORAL | Status: DC

## 2013-05-19 IMAGING — CR DG HAND COMPLETE 3+V*R*
2 series · 2 of 2 positions shown · non-contrast
Comparison: None.

CLINICAL DATA: Lesion along the distal aspect of the right middle
finger.

RIGHT HAND - COMPLETE 3+ VIEW

[view not recorded (1 of 2)]
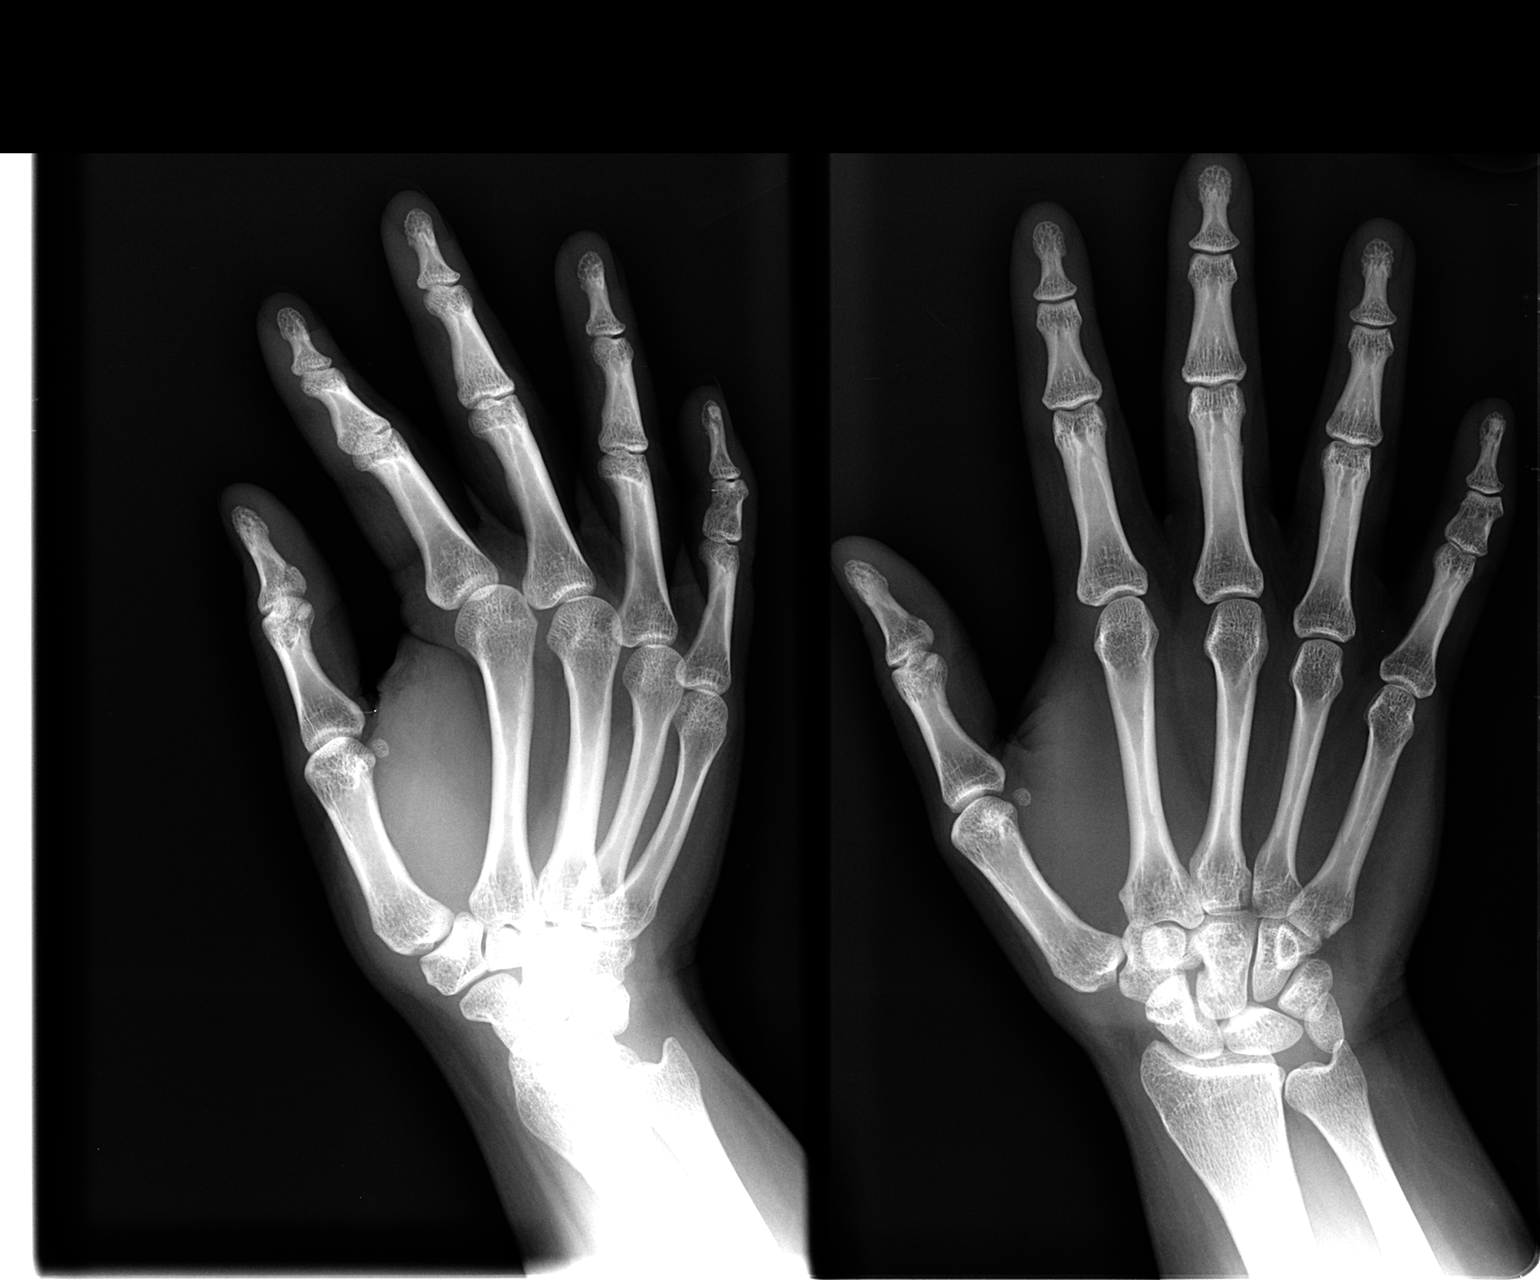

[view not recorded (2 of 2)]
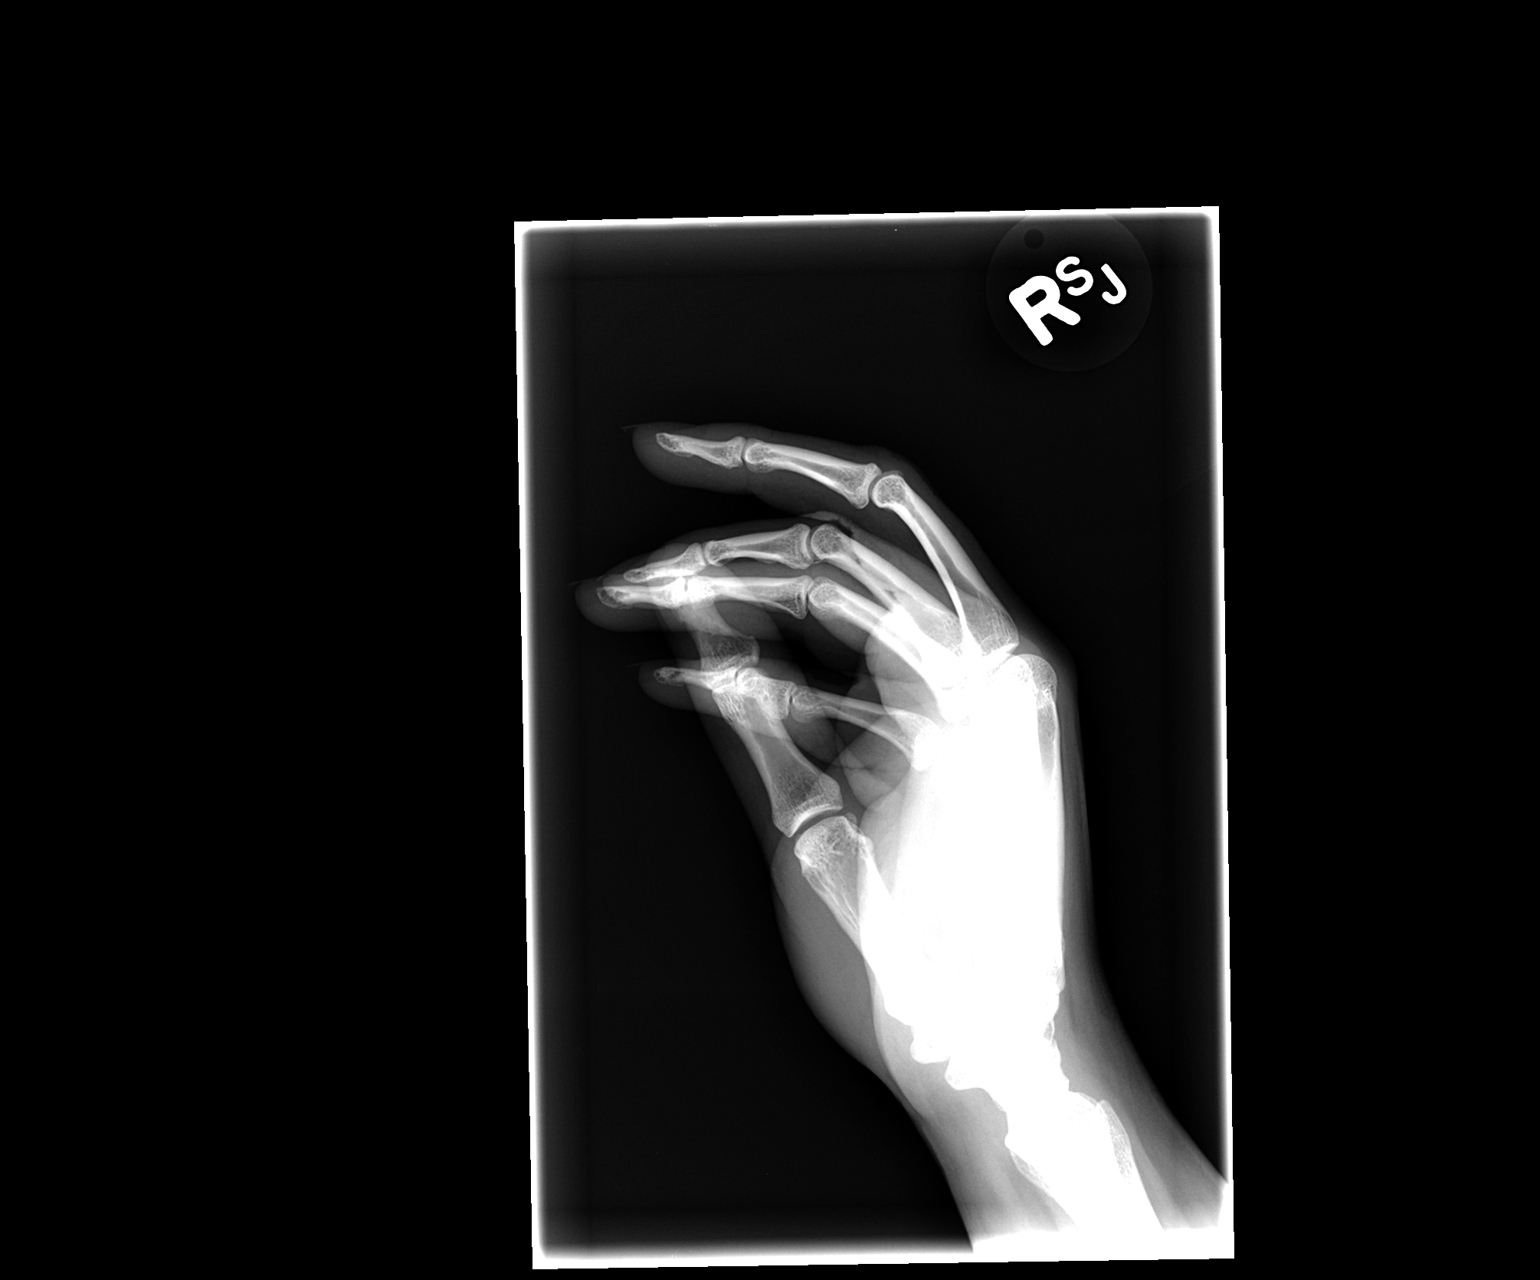

[2 of 2 positions shown; findings below may reference images not displayed]

FINDINGS: No focal bony or soft tissue abnormality is identified.
Imaged bones, joints and soft tissues appear normal.
IMPRESSION: Negative exam.

## 2013-12-31 ENCOUNTER — Ambulatory Visit: Payer: BC Managed Care – PPO | Admitting: Family Medicine

## 2014-01-07 ENCOUNTER — Emergency Department (INDEPENDENT_AMBULATORY_CARE_PROVIDER_SITE_OTHER)
Admission: EM | Admit: 2014-01-07 | Discharge: 2014-01-07 | Disposition: A | Discharge status: Home / Self Care | Payer: BC Managed Care – PPO | Source: Home / Self Care | Attending: Family Medicine | Admitting: Family Medicine

## 2014-01-07 ENCOUNTER — Encounter: Payer: Self-pay | Admitting: Emergency Medicine

## 2014-01-07 ENCOUNTER — Other Ambulatory Visit: Payer: Self-pay | Admitting: Family Medicine

## 2014-01-07 DIAGNOSIS — N760 Acute vaginitis: Secondary | ICD-10-CM

## 2014-01-07 LAB — POCT URINALYSIS DIP (MANUAL ENTRY)
Glucose, UA: NEGATIVE
Ketones, POC UA: NEGATIVE
Leukocytes, UA: NEGATIVE
Nitrite, UA: NEGATIVE
Spec Grav, UA: >=1.03 (ref 1.005–1.03)
Urobilinogen, UA: 0.2 (ref 0–1)
pH, UA: 5.5 (ref 5–8)

## 2014-01-07 NOTE — Discharge Instructions (Signed)
Thank you for coming in today. We will call if anything is positive.  Come back as needed.   Vaginitis Vaginitis is an inflammation of the vagina. It is most often caused by a change in the normal balance of the bacteria and yeast that live in the vagina. This change in balance causes an overgrowth of certain bacteria or yeast, which causes the inflammation. There are different types of vaginitis, but the most common types are:  Bacterial vaginosis.  Yeast infection (candidiasis).  Trichomoniasis vaginitis. This is a sexually transmitted infection (STI).  Viral vaginitis.  Atropic vaginitis.  Allergic vaginitis. CAUSES  The cause depends on the type of vaginitis. Vaginitis can be caused by:  Bacteria (bacterial vaginosis).  Yeast (yeast infection).  A parasite (trichomoniasis vaginitis)  A virus (viral vaginitis).  Low hormone levels (atrophic vaginitis). Low hormone levels can occur during pregnancy, breastfeeding, or after menopause.  Irritants, such as bubble baths, scented tampons, and feminine sprays (allergic vaginitis). Other factors can change the normal balance of the yeast and bacteria that live in the vagina. These include:  Antibiotic medicines.  Poor hygiene.  Diaphragms, vaginal sponges, spermicides, birth control pills, and intrauterine devices (IUD).  Sexual intercourse.  Infection.  Uncontrolled diabetes.  A weakened immune system. SYMPTOMS  Symptoms can vary depending on the cause of the vaginitis. Common symptoms include:  Abnormal vaginal discharge.  The discharge is white, gray, or yellow with bacterial vaginosis.  The discharge is thick, white, and cheesy with a yeast infection.  The discharge is frothy and yellow or greenish with trichomoniasis.  A bad vaginal odor.  The odor is fishy with bacterial vaginosis.  Vaginal itching, pain, or swelling.  Painful intercourse.  Pain or burning when urinating. Sometimes, there are no  symptoms. TREATMENT  Treatment will vary depending on the type of infection.   Bacterial vaginosis and trichomoniasis are often treated with antibiotic creams or pills.  Yeast infections are often treated with antifungal medicines, such as vaginal creams or suppositories.  Viral vaginitis has no cure, but symptoms can be treated with medicines that relieve discomfort. Your sexual partner should be treated as well.  Atrophic vaginitis may be treated with an estrogen cream, pill, suppository, or vaginal ring. If vaginal dryness occurs, lubricants and moisturizing creams may help. You may be told to avoid scented soaps, sprays, or douches.  Allergic vaginitis treatment involves quitting the use of the product that is causing the problem. Vaginal creams can be used to treat the symptoms. HOME CARE INSTRUCTIONS   Take all medicines as directed by your caregiver.  Keep your genital area clean and dry. Avoid soap and only rinse the area with water.  Avoid douching. It can remove the healthy bacteria in the vagina.  Do not use tampons or have sexual intercourse until your vaginitis has been treated. Use sanitary pads while you have vaginitis.  Wipe from front to back. This avoids the spread of bacteria from the rectum to the vagina.  Let air reach your genital area.  Wear cotton underwear to decrease moisture buildup.  Avoid wearing underwear while you sleep until your vaginitis is gone.  Avoid tight pants and underwear or nylons without a cotton panel.  Take off wet clothing (especially bathing suits) as soon as possible.  Use mild, non-scented products. Avoid using irritants, such as:  Scented feminine sprays.  Fabric softeners.  Scented detergents.  Scented tampons.  Scented soaps or bubble baths.  Practice safe sex and use condoms. Condoms may  prevent the spread of trichomoniasis and viral vaginitis. SEEK MEDICAL CARE IF:   You have abdominal pain.  You have a fever or  persistent symptoms for more than 2-3 days.  You have a fever and your symptoms suddenly get worse. Document Released: 01/08/2007 Document Revised: 12/06/2011 Document Reviewed: 08/24/2011 [facility] Patient Information 2015 [place], [place]. This information is not intended to replace advice given to you by your health care provider. Make sure you discuss any questions you have with your health care provider.

## 2014-01-07 NOTE — ED Notes (Signed)
Pt c/o vaginal irritation last wk was given Diflucan by her GYN, took 2 doses and feels almost completley better.  She also c/o some lower abd pressure with sitting.

## 2014-01-07 NOTE — ED Provider Notes (Signed)
Judy Marshall is a 33 y.o. female who presents to Urgent Care today for vaginal irritation and discharge. Patient developed vaginal discharge and irritation a week ago. Her OB/GYN cold and fluconazole which has helped a lot. She's feeling much better but notices symptoms continued mild irritation. Last year she had several week history of difficult to treat BV and yeast. She would like to be tested today to make sure nothing bad is going on. No significant urinary symptoms; patient has had some spotting recently.   Past Medical History  Diagnosis Date  . Pericarditis 2009  . IBS (irritable bowel syndrome)    History  Substance Use Topics  . Smoking status: Never Smoker   . Smokeless tobacco: Never Used  . Alcohol Use: No   ROS as above Medications: No current facility-administered medications for this encounter.   Current Outpatient Prescriptions  Medication Sig Dispense Refill  . fluconazole (DIFLUCAN) 150 MG tablet Take one tablet now and one tablet at end of treatment.  2 tablet  0  . levonorgestrel-ethinyl estradiol (AVIANE,ALESSE,LESSINA) 0.1-20 MG-MCG tablet Take 1 tablet by mouth daily.  1 Package  0  . metroNIDAZOLE (METROGEL) 0.75 % vaginal gel Place 1 Applicatorful vaginally at bedtime. For 5 days.  70 g  0    Exam:  BP 170/97  Pulse 80  Temp(Src) 98.3 F (36.8 C) (Oral)  Resp 16  Ht 5\' 6"  (1.676 m)  Wt 265 lb (120.203 kg)  BMI 42.79 kg/m2  SpO2 100%  LMP 12/17/2013 Gen: Well NAD  HEENT: EOMI,  MMM Lungs: Normal work of breathing. CTABL Heart: RRR no MRG Abd: NABS, Soft. Nondistended, Nontender Exts: Brisk capillary refill, warm and well perfused.  GYN: Normal external genitalia. Vaginal canal with no significant discharge. Normal-appearing cervix. Nontender.   Urinalysis significant for small blood, small bilirubin, trace protein.  No results found for this or any previous visit (from the past 24 hour(s)). No results found.  Assessment and Plan: 33  y.o. female with vaginitis. Cytology pending  wet prep and gonorrhea and Chlamydia.  Will call patient with results. Urine likely contaminated with menstrual blood. Culture pending.  Discussed warning signs or symptoms. Please see discharge instructions. Patient expresses understanding.     Rodolph BongEvan S Axel Meas, MD 01/07/14 [id number]

## 2014-01-08 LAB — GC/CHLAMYDIA PROBE AMP
CT Probe RNA: NEGATIVE
GC Probe RNA: NEGATIVE

## 2014-01-08 LAB — WET PREP, GENITAL
Trich, Wet Prep: NONE SEEN
Yeast Wet Prep HPF POC: NONE SEEN

## 2014-01-09 LAB — URINE CULTURE: Colony Count: >=100000

## 2014-01-10 ENCOUNTER — Telehealth: Payer: Self-pay | Admitting: Emergency Medicine

## 2014-01-13 ENCOUNTER — Telehealth: Payer: Self-pay | Admitting: *Deleted

## 2014-01-13 MED ORDER — METRONIDAZOLE 500 MG PO TABS: 500.0000 mg | ORAL_TABLET | Freq: Two times a day (BID) | ORAL | Status: AC

## 2014-01-13 MED ORDER — FLUCONAZOLE 150 MG PO TABS: 150.0000 mg | ORAL_TABLET | Freq: Once | ORAL | Status: DC

## 2014-05-22 ENCOUNTER — Other Ambulatory Visit (HOSPITAL_COMMUNITY)
Admission: RE | Admit: 2014-05-22 | Discharge: 2014-05-22 | Disposition: A | Discharge status: Home / Self Care | Payer: BLUE CROSS/BLUE SHIELD | Source: Ambulatory Visit | Attending: Family Medicine | Admitting: Family Medicine

## 2014-05-22 ENCOUNTER — Encounter: Payer: Self-pay | Admitting: Family Medicine

## 2014-05-22 ENCOUNTER — Ambulatory Visit (INDEPENDENT_AMBULATORY_CARE_PROVIDER_SITE_OTHER): Payer: BLUE CROSS/BLUE SHIELD | Admitting: Family Medicine

## 2014-05-22 VITALS — BP 142/81 | HR 70 | Ht 66.0 in | Wt 268.0 lb

## 2014-05-22 DIAGNOSIS — R87810 Cervical high risk human papillomavirus (HPV) DNA test positive: Secondary | ICD-10-CM | POA: Diagnosis present

## 2014-05-22 DIAGNOSIS — Z13220 Encounter for screening for lipoid disorders: Secondary | ICD-10-CM | POA: Diagnosis not present

## 2014-05-22 DIAGNOSIS — Z113 Encounter for screening for infections with a predominantly sexual mode of transmission: Secondary | ICD-10-CM | POA: Diagnosis present

## 2014-05-22 DIAGNOSIS — L918 Other hypertrophic disorders of the skin: Secondary | ICD-10-CM

## 2014-05-22 DIAGNOSIS — Z01419 Encounter for gynecological examination (general) (routine) without abnormal findings: Secondary | ICD-10-CM

## 2014-05-22 DIAGNOSIS — Z124 Encounter for screening for malignant neoplasm of cervix: Secondary | ICD-10-CM

## 2014-05-22 DIAGNOSIS — R87612 Low grade squamous intraepithelial lesion on cytologic smear of cervix (LGSIL): Secondary | ICD-10-CM | POA: Diagnosis not present

## 2014-05-22 DIAGNOSIS — Z01411 Encounter for gynecological examination (general) (routine) with abnormal findings: Secondary | ICD-10-CM | POA: Insufficient documentation

## 2014-05-22 DIAGNOSIS — Z114 Encounter for screening for human immunodeficiency virus [HIV]: Secondary | ICD-10-CM

## 2014-05-22 DIAGNOSIS — Z1151 Encounter for screening for human papillomavirus (HPV): Secondary | ICD-10-CM | POA: Insufficient documentation

## 2014-05-22 MED ORDER — LEVONORGESTREL-ETHINYL ESTRAD 0.1-20 MG-MCG PO TABS: 1.0000 | ORAL_TABLET | Freq: Every day | ORAL | Status: DC

## 2014-05-22 NOTE — Patient Instructions (Signed)
Keep up a regular exercise program and make sure you are eating a healthy diet Try to eat 4 servings of dairy a day, or if you are lactose intolerant take a calcium with vitamin D daily.  Your vaccines are up to date.   

## 2014-05-22 NOTE — Addendum Note (Signed)
Addended by: Deno EtienneBARKLEY, Jontue Crumpacker L on: 05/22/2014 05:19 PM   Modules accepted: Orders

## 2014-05-22 NOTE — Progress Notes (Signed)
Subjective:     Judy Marshall is a 34 y.o. female and is heAddison Baileyre for a comprehensive physical exam. The patient reports no problems. History of abnormal Pap smear. She had low-grade squamous on her last Pap smear about a year and half ago. She was referred to OB/GYN for biopsy, but did not go.   History   Social History  . Marital Status: Married    Spouse Name: Judy Marshall  . Number of Children: 0  . Years of Education: N/A   Occupational History  . RECEPTIONIST     [employer]  .     Social History Main Topics  . Smoking status: Never Smoker   . Smokeless tobacco: Never Used  . Alcohol Use: No  . Drug Use: No  . Sexual Activity:    Partners: Male   Other Topics Concern  . Not on file   Social History Narrative   Not on an exercise program.   Health Maintenance  Topic Date Due  . HIV Screening  10/04/1996  . INFLUENZA VACCINE  05/23/2015 (Originally 10/25/2013)  . PAP SMEAR  10/09/2015  . TETANUS/TDAP  12/30/2015    The following portions of the patient's history were reviewed and updated as appropriate: allergies, current medications, past family history, past medical history, past social history, past surgical history and problem list.  Review of Systems A comprehensive review of systems was negative.   Objective:    BP 142/81 mmHg  Pulse 70  Ht 5\' 6"  (1.676 m)  Wt 268 lb (121.564 kg)  BMI 43.28 kg/m2 General appearance: alert, cooperative and appears stated age Head: Normocephalic, without obvious abnormality, atraumatic Eyes: conj clear, EOMI, PEERLA Ears: normal TM's and external ear canals both ears Nose: Nares normal. Septum midline. Mucosa normal. No drainage or sinus tenderness. Throat: lips, mucosa, and tongue normal; teeth and gums normal Neck: no adenopathy, no carotid bruit, no JVD, supple, symmetrical, trachea midline and thyroid not enlarged, symmetric, no tenderness/mass/nodules Back: symmetric, no curvature. ROM normal. No CVA  tenderness. Lungs: clear to auscultation bilaterally Breasts: normal appearance, no masses or tenderness Heart: regular rate and rhythm, S1, S2 normal, no murmur, click, rub or gallop Abdomen: soft, non-tender; bowel sounds normal; no masses,  no organomegaly Pelvic: cervix normal in appearance, external genitalia normal, no adnexal masses or tenderness, no cervical motion tenderness, rectovaginal septum normal, uterus normal size, shape, and consistency, vagina normal without discharge and easily friable Extremities: extremities normal, atraumatic, no cyanosis or edema Pulses: 2+ and symmetric Skin: Skin color, texture, turgor normal. No rashes or lesions Lymph nodes: Cervical, supraclavicular, and axillary nodes normal. Neurologic: Alert and oriented X 3, normal strength and tone. Normal symmetric reflexes. Normal coordination and gait    Assessment:    Healthy female exam.      Plan:     See After Visit Summary for Counseling Recommendations   Keep up a regular exercise program and make sure you are eating a healthy diet Try to eat 4 servings of dairy a day, or if you are lactose intolerant take a calcium with vitamin D daily.  Your vaccines are up to date.   Skin tag removal - see note below  Abnormal Pap smear-repeated today. Discussed with her the importance of seeing OB/GYN for further evaluation. Explained to her that they actually look at the cells underneath the microscope to see if they are abnormal and depending on the degree of abnormality then it determines what is the next up  in the care plan.   Subjective:     Judy Marshall is a 34 y.o. female who complains of skin tags. The patient wishes skin tag removed.   The following portions of the patient's history were reviewed and updated as appropriate: allergies, current medications, past family history, past medical history, past social history, past surgical history and problem list.  Review of Systems A  comprehensive review of systems was negative.    Objective:    Skin:1 skin tag under the right axilla and one on her right and left buttock checks.  Total of 3.  They were removed using scissors and forceps after alcohol prep; hemostasis is obtained with aluminum chloride and discarded.    Assessment:    Chronically irritated skin tag, removed.    Plan:     1. The patient is instructed to watch for signs of infection including erythema, pain,      purulent discharge, or crusting.  2. Verbal patient instruction given. 3. Follow up as needed for acute illness.

## 2014-05-26 LAB — CYTOLOGY - PAP

## 2014-05-28 ENCOUNTER — Other Ambulatory Visit: Payer: Self-pay | Admitting: *Deleted

## 2014-05-28 DIAGNOSIS — R87612 Low grade squamous intraepithelial lesion on cytologic smear of cervix (LGSIL): Secondary | ICD-10-CM | POA: Insufficient documentation

## 2014-05-28 NOTE — Addendum Note (Signed)
Addended by: Nani GasserMETHENEY, Cylinda Santoli D on: 05/28/2014 09:20 AM   Modules accepted: Orders

## 2014-06-11 ENCOUNTER — Encounter: Payer: BLUE CROSS/BLUE SHIELD | Admitting: Obstetrics & Gynecology

## 2014-06-25 ENCOUNTER — Ambulatory Visit (INDEPENDENT_AMBULATORY_CARE_PROVIDER_SITE_OTHER): Payer: BLUE CROSS/BLUE SHIELD | Admitting: Obstetrics & Gynecology

## 2014-06-25 ENCOUNTER — Encounter: Payer: Self-pay | Admitting: Obstetrics & Gynecology

## 2014-06-25 ENCOUNTER — Other Ambulatory Visit: Payer: Self-pay | Admitting: Obstetrics & Gynecology

## 2014-06-25 VITALS — BP 148/96 | HR 92 | Resp 16 | Ht 66.0 in | Wt 262.0 lb

## 2014-06-25 DIAGNOSIS — R87612 Low grade squamous intraepithelial lesion on cytologic smear of cervix (LGSIL): Secondary | ICD-10-CM

## 2014-06-25 DIAGNOSIS — R87810 Cervical high risk human papillomavirus (HPV) DNA test positive: Secondary | ICD-10-CM

## 2014-06-25 DIAGNOSIS — IMO0002 Reserved for concepts with insufficient information to code with codable children: Secondary | ICD-10-CM

## 2014-06-25 NOTE — Progress Notes (Signed)
   Subjective:    Patient ID: Addison BaileyKimberly D Valentine, female    DOB: 03-21-1981, 34 y.o.   MRN: [id number]  HPI  34 yo SW G0 here for a colposcopy. She had an abnormal pap in 2014 but did not get follow up. Her pap last month was LGSIL.   Review of Systems     Objective:   Physical Exam  UPT negative, consent signed, time out done Cervix prepped with acetic acid. Transformation zone seen in its entirety. Colpo adequate. Changes c/w LGSIL seen in a circumferencial fashion at the os (acetowhite changes)  There was also mosaicism and punctation at the 12 and 3 o'clock positions. These were both biopsied. ECC obtained. She tolerated the procedure well.        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

## 2014-06-30 LAB — PATHOLOGY

## 2014-07-14 ENCOUNTER — Encounter: Payer: Self-pay | Admitting: Obstetrics & Gynecology

## 2014-07-14 ENCOUNTER — Ambulatory Visit (INDEPENDENT_AMBULATORY_CARE_PROVIDER_SITE_OTHER): Payer: BLUE CROSS/BLUE SHIELD | Admitting: Obstetrics & Gynecology

## 2014-07-14 VITALS — Ht 66.0 in | Wt 262.0 lb

## 2014-07-14 DIAGNOSIS — N870 Mild cervical dysplasia: Secondary | ICD-10-CM

## 2014-07-14 NOTE — Progress Notes (Signed)
   Subjective:    Patient ID: Judy Marshall, female    DOB: [birth year], 34 y.o.   MRN: [id number]  HPI She is here to discuss her pathology results.   Review of Systems     Objective:   Physical Exam CIN1 on pathology [id number]       Assessment & Plan:  CIN1 - I offered her watchful waiting versus cryo She opts for cryo and will schedule it

## 2014-07-23 ENCOUNTER — Ambulatory Visit (INDEPENDENT_AMBULATORY_CARE_PROVIDER_SITE_OTHER): Payer: BLUE CROSS/BLUE SHIELD | Admitting: Obstetrics & Gynecology

## 2014-07-23 ENCOUNTER — Encounter: Payer: Self-pay | Admitting: Obstetrics & Gynecology

## 2014-07-23 VITALS — BP 133/86 | HR 73 | Resp 16 | Ht 66.0 in | Wt 262.0 lb

## 2014-07-23 DIAGNOSIS — N870 Mild cervical dysplasia: Secondary | ICD-10-CM | POA: Diagnosis not present

## 2014-07-23 DIAGNOSIS — Z3202 Encounter for pregnancy test, result negative: Secondary | ICD-10-CM

## 2014-07-23 DIAGNOSIS — Z01812 Encounter for preprocedural laboratory examination: Secondary | ICD-10-CM

## 2014-07-23 LAB — POCT URINE PREGNANCY: Preg Test, Ur: NEGATIVE

## 2014-07-23 NOTE — Progress Notes (Signed)
   Subjective:    Patient ID: Judy Marshall, female    DOB: 16-Mar-1981, 34 y.o.   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  Review of Systems     Objective:   Physical Exam UPT negative. Consent signed. Questions answered Cryo applied to cervix for 3 minutes. She tolerated this well.       Assessment & Plan:  RTC 1 year/prn sooner Post cryo instructions given

## 2014-07-29 ENCOUNTER — Telehealth (INDEPENDENT_AMBULATORY_CARE_PROVIDER_SITE_OTHER): Payer: BLUE CROSS/BLUE SHIELD | Admitting: *Deleted

## 2014-07-29 DIAGNOSIS — Z789 Other specified health status: Secondary | ICD-10-CM

## 2014-07-29 NOTE — Telephone Encounter (Signed)
Pt called in stating FMLA paperwork needed to be completed for time away from work for [employer] and [employer]. Completed paperwork and faxed back to pt.

## 2014-08-20 ENCOUNTER — Ambulatory Visit (INDEPENDENT_AMBULATORY_CARE_PROVIDER_SITE_OTHER): Payer: BLUE CROSS/BLUE SHIELD | Admitting: Obstetrics & Gynecology

## 2014-08-20 ENCOUNTER — Encounter: Payer: Self-pay | Admitting: Obstetrics & Gynecology

## 2014-08-20 VITALS — BP 126/83 | HR 68 | Ht 66.0 in | Wt 262.0 lb

## 2014-08-20 DIAGNOSIS — N870 Mild cervical dysplasia: Secondary | ICD-10-CM

## 2014-08-20 NOTE — Progress Notes (Signed)
   Subjective:    Patient ID: Judy Marshall, female    DOB: Mar 16, 1981, 34 y.o.   MRN: [id number]  HPI  This 34 yo lady is here 1 month after a cryo for a re check of her cervix. She has no problems, has been sexually active.  She had a watery copious discharge as expected for several days but that has resolved.  Review of Systems     Objective:   Physical Exam Well-healed cervix [id number] Breathing and ambulating normally Abd- obese, benign       Assessment & Plan:  S/p cryo for CIN1 RTC 1 year for annual/pap with cotesting

## 2016-03-23 DIAGNOSIS — J1189 Influenza due to unidentified influenza virus with other manifestations: Secondary | ICD-10-CM | POA: Diagnosis not present

## 2017-02-08 ENCOUNTER — Ambulatory Visit: Payer: BLUE CROSS/BLUE SHIELD | Admitting: Obstetrics & Gynecology

## 2017-02-19 ENCOUNTER — Encounter: Payer: Self-pay | Admitting: Obstetrics & Gynecology

## 2017-02-19 ENCOUNTER — Ambulatory Visit (INDEPENDENT_AMBULATORY_CARE_PROVIDER_SITE_OTHER): Payer: BLUE CROSS/BLUE SHIELD | Admitting: Obstetrics & Gynecology

## 2017-02-19 VITALS — BP 142/85 | HR 76 | Resp 16 | Ht 66.0 in | Wt 279.0 lb

## 2017-02-19 DIAGNOSIS — N9089 Other specified noninflammatory disorders of vulva and perineum: Secondary | ICD-10-CM | POA: Diagnosis not present

## 2017-02-19 DIAGNOSIS — Z01419 Encounter for gynecological examination (general) (routine) without abnormal findings: Secondary | ICD-10-CM

## 2017-02-19 DIAGNOSIS — Z1151 Encounter for screening for human papillomavirus (HPV): Secondary | ICD-10-CM

## 2017-02-19 DIAGNOSIS — Z124 Encounter for screening for malignant neoplasm of cervix: Secondary | ICD-10-CM | POA: Diagnosis not present

## 2017-02-19 MED ORDER — LEVONORGESTREL-ETHINYL ESTRAD 0.1-20 MG-MCG PO TABS: 1.0000 | ORAL_TABLET | Freq: Every day | ORAL | 11 refills | Status: DC

## 2017-02-19 NOTE — Progress Notes (Signed)
Subjective:    [name] is a 36 y.o. married P0 female who presents for an annual exam. The patient has no complaints today. She would like a skin tag in her left groin removed. The patient is sexually active. GYN screening history: last pap: was normal but she had biopsy-proven CIN1 in 2016. The patient wears seatbelts: yes. The patient participates in regular exercise: yes. Has the patient ever been transfused or tattooed?: no. The patient reports that there is not domestic violence in her life.   Menstrual History: OB History    Gravida Para Term Preterm AB Living   0 0 0 0 0 0   SAB TAB Ectopic Multiple Live Births   0 0 0 0        Menarche age: [age] Patient's last menstrual period was 02/08/2017.    The following portions of the patient's history were reviewed and updated as appropriate: allergies, current medications, past family history, past medical history, past social history, past surgical history and problem list.  Review of Systems Pertinent items are noted in HPI.   Married 4/18! Denies dyspareunia, uses OCPs, needs a refill Doesn't want kids. [place] [employer] [place] FH- no breast/gyn/colon cancer + stomach cancer in maternal GM She had a flu vaccine 10/18.   Objective:    BP (!) 142/85   Pulse 76   Resp 16   Ht 5\' 6"  (1.676 m)   Wt 279 lb (126.6 kg)   LMP 02/08/2017   BMI 45.03 kg/m   General Appearance:    Alert, cooperative, no distress, appears stated age  Head:    Normocephalic, without obvious abnormality, atraumatic  Eyes:    PERRL, conjunctiva/corneas clear, EOM's intact, fundi    benign, both eyes  Ears:    Normal TM's and external ear canals, both ears  Nose:   Nares normal, septum midline, mucosa normal, no drainage    or sinus tenderness  Throat:   Lips, mucosa, and tongue normal; teeth and gums normal  Neck:   Supple, symmetrical, trachea midline, no adenopathy;    thyroid:  no  enlargement/tenderness/nodules; no carotid   bruit or JVD  Back:     Symmetric, no curvature, ROM normal, no CVA tenderness  Lungs:     Clear to auscultation bilaterally, respirations unlabored  Chest Wall:    No tenderness or deformity   Heart:    Regular rate and rhythm, S1 and S2 normal, no murmur, rub   or gallop  Breast Exam:    No tenderness, masses, or nipple abnormality  Abdomen:     Soft, non-tender, bowel sounds active all four quadrants,    no masses, no organomegaly  Genitalia:    Normal female without lesion, discharge or tenderness, skin tag in left groin removed after betadine prep and 1% lidocaine for anesthesia. Silver nitrate was used for hemostasis Cervix appeared normal. No masses were palpable with bimanual exam. Uterus felt normal.     Extremities:   Extremities normal, atraumatic, no cyanosis or edema  Pulses:   2+ and symmetric all extremities  Skin:   Skin color, texture, turgor normal, no rashes or lesions  Lymph nodes:   Cervical, supraclavicular, and axillary nodes normal  Neurologic:   CNII-XII intact, normal strength, sensation and reflexes    throughout  .    Assessment:    Healthy female exam.    Plan:     Thin prep Pap smear. with cotesting She declines Gardasil.

## 2017-02-22 LAB — CYTOLOGY - PAP
Diagnosis: NEGATIVE
HPV: NOT DETECTED

## 2017-07-10 ENCOUNTER — Other Ambulatory Visit: Payer: BLUE CROSS/BLUE SHIELD

## 2017-07-10 DIAGNOSIS — N898 Other specified noninflammatory disorders of vagina: Secondary | ICD-10-CM

## 2017-07-10 DIAGNOSIS — R350 Frequency of micturition: Secondary | ICD-10-CM

## 2017-07-10 NOTE — Progress Notes (Signed)
PT c/o frequent urination and vaginal irritation. Pt states she is prone to yeast infections. Wet prep (self swab) and urine culture sent.

## 2017-07-11 ENCOUNTER — Telehealth: Payer: Self-pay

## 2017-07-11 DIAGNOSIS — N760 Acute vaginitis: Secondary | ICD-10-CM

## 2017-07-11 DIAGNOSIS — B9689 Other specified bacterial agents as the cause of diseases classified elsewhere: Secondary | ICD-10-CM

## 2017-07-11 DIAGNOSIS — B379 Candidiasis, unspecified: Secondary | ICD-10-CM

## 2017-07-11 LAB — WET PREP FOR TRICH, YEAST, CLUE
MICRO NUMBER:: 90470076
Specimen Quality: ADEQUATE

## 2017-07-11 MED ORDER — METRONIDAZOLE 500 MG PO TABS: 500.0000 mg | ORAL_TABLET | Freq: Two times a day (BID) | ORAL | 0 refills | Status: DC

## 2017-07-11 MED ORDER — FLUCONAZOLE 150 MG PO TABS: 150.0000 mg | ORAL_TABLET | Freq: Once | ORAL | 0 refills | Status: AC

## 2017-07-11 NOTE — Telephone Encounter (Signed)
Pt returning call. Pt is aware of positive BV results and [facility] [place] Pt requested Diflucan because she states she gets a yeast infection when she takes Flagyl. Mariel AloeLora Clark, RN approved this.

## 2017-07-11 NOTE — Telephone Encounter (Signed)
Left message on pt's voicemail to let her know we have results from wet prep (positive for BV) . No answer. Need to verify pharmacy. Provided office number for pt to call back for results.

## 2017-07-12 LAB — URINE CULTURE

## 2017-07-25 DIAGNOSIS — D485 Neoplasm of uncertain behavior of skin: Secondary | ICD-10-CM | POA: Diagnosis not present

## 2017-07-25 DIAGNOSIS — D1801 Hemangioma of skin and subcutaneous tissue: Secondary | ICD-10-CM | POA: Diagnosis not present

## 2017-07-25 DIAGNOSIS — D2261 Melanocytic nevi of right upper limb, including shoulder: Secondary | ICD-10-CM | POA: Diagnosis not present

## 2017-07-25 DIAGNOSIS — D2262 Melanocytic nevi of left upper limb, including shoulder: Secondary | ICD-10-CM | POA: Diagnosis not present

## 2017-08-30 ENCOUNTER — Ambulatory Visit: Payer: BLUE CROSS/BLUE SHIELD | Admitting: Family Medicine

## 2017-08-30 ENCOUNTER — Encounter: Payer: Self-pay | Admitting: Family Medicine

## 2017-08-30 VITALS — BP 127/68 | HR 73 | Ht 66.0 in | Wt 279.0 lb

## 2017-08-30 DIAGNOSIS — M7989 Other specified soft tissue disorders: Secondary | ICD-10-CM | POA: Diagnosis not present

## 2017-08-30 DIAGNOSIS — R609 Edema, unspecified: Secondary | ICD-10-CM

## 2017-08-30 NOTE — Patient Instructions (Signed)
How to Use Compression Stockings Compression stockings are elastic socks that squeeze the legs. They help to increase blood flow to the legs, decrease swelling in the legs, and reduce the chance of developing blood clots in the lower legs. Compression stockings are often used by people who:  Are recovering from surgery.  Have poor circulation in their legs.  Are prone to getting blood clots in their legs.  Have varicose veins.  Sit or stay in bed for long periods of time.  How to use compression stockings Before you put on your compression stockings:  Make sure that they are the correct size. If you do not know your size, ask your health care provider.  Make sure that they are clean, dry, and in good condition.  Check them for rips and tears. Do not put them on if they are ripped or torn.  Put your stockings on first thing in the morning, before you get out of bed. Keep them on for as long as your health care provider advises. When you are wearing your stockings:  Keep them as smooth as possible. Do not allow them to bunch up. It is especially important to prevent the stockings from bunching up around your toes or behind your knees.  Do not roll the stockings downward and leave them rolled down. This can decrease blood flow to your leg.  Change them right away if they become wet or dirty.  When you take off your stockings, inspect your legs and feet. Anything that does not seem normal may require medical attention. Look for:  Open sores.  Red spots.  Swelling.  Information and tips  Do not stop wearing your compression stockings without talking to your health care provider first.  Wash your stockings every day with mild detergent in cold or warm water. Do not use bleach. Air-dry your stockings or dry them in a clothes dryer on low heat.  Replace your stockings every 3-6 months.  If skin moisturizing is part of your treatment plan, apply lotion or cream at night so that  your skin will be dry when you put on the stockings in the morning. It is harder to put the stockings on when you have lotion on your legs or feet. Contact a health care provider if: Remove your stockings and seek medical care if:  You have a feeling of pins and needles in your feet or legs.  You have any new changes in your skin.  You have skin lesions that are getting worse.  You have swelling or pain that is getting worse.  Get help right away if:  You have numbness or tingling in your lower legs that does not get better right after you take the stockings off.  Your toes or feet become cold and blue.  You develop open sores or red spots on your legs that do not go away.  You see or feel a warm spot on your leg.  You have new swelling or soreness in your leg.  You are short of breath or you have chest pain for no reason.  You have a rapid or irregular heartbeat.  You feel light-headed or dizzy. This information is not intended to replace advice given to you by your health care provider. Make sure you discuss any questions you have with your health care provider. Document Released: 01/08/2009 Document Revised: 08/11/2015 Document Reviewed: 02/18/2014 Elsevier Interactive Patient Education  2018 [employer]  

## 2017-08-30 NOTE — Progress Notes (Signed)
Subjective:    Patient ID: Judy Marshall, female    DOB: Mar 08, 1981, 37 y.o.   MRN: [id number]  HPI 37 year old otherwise healthy female comes in today complaining of bilateral ankle swelling worse on the left compared to the right.  pt reports that she feels that this has been going on for ~6 mos. she denies any pain, the L ankle/foot she feel is worse. she states that her ankles feel tight in the middle of the day but better in the mornings. 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  She denies any chest pain or shortness of breath currently.  No dysuria or other urinary symptoms.  She did note that about a month ago she had a week of pretty severe reflux symptoms.  She started taking some omeprazole over-the-counter and that made a big difference.  For couple days for the end of that week she did have some chest pain that was worse when she laid back but that is actually completely resolved now.  She denies any leg cramps though has a history of low potassium.  She did have a biometric screen at work which I do not have a copy of in January and says that everything came back normal.  She denies any old trauma or injuries to her ankles or feet.   Review of Systems  BP 127/68   Pulse 73   Ht 5\' 6"  (1.676 m)   Wt 279 lb (126.6 kg)   SpO2 100%   BMI 45.03 kg/m     No Known Allergies  Past Medical History:  Diagnosis Date  . Broken bones 1989   Left elbow  . IBS (irritable bowel syndrome)   . Pericarditis 2009    Past Surgical History:  Procedure Laterality Date  . Left arm fracture  1989  . TONSILLECTOMY  2004    Social History   Socioeconomic History  . Marital status: Married    Spouse name: Holley Dexter  . Number of children: 0  . Years of education: Not on file  . Highest education level: Not on file  Occupational History  . Occupation: Dance movement psychotherapist: [employer]    Comment: [place] [employer] [place]    Employer: [place] [employer]  Social Needs  . Financial resource strain: Not on file  . Food insecurity:    Worry: Not on file    Inability: Not on file  . Transportation needs:    Medical: Not on file    Non-medical: Not on file  Tobacco Use  . Smoking status: Never Smoker  . Smokeless tobacco: Never Used  Substance and Sexual Activity  . Alcohol use: No  . Drug use: No  . Sexual activity: Yes    Partners: Male    Birth control/protection: Pill  Lifestyle  . Physical activity:    Days per week: Not on file    Minutes per session: Not on file  . Stress: Not on file  Relationships  . Social connections:    Talks on phone: Not on file    Gets together: Not on file    Attends religious service: Not on file    Active member of club or organization: Not on file    Attends meetings of clubs or organizations: Not on file    Relationship status: Not on file  . Intimate  partner violence:    Fear of current or ex partner: Not on file    Emotionally abused: Not on file    Physically abused: Not on file    Forced sexual activity: Not on file  Other Topics Concern  . Not on file  Social History Narrative   Not on an exercise program.    Family History  Problem Relation Age of Onset  . Hypertension Mother   . Stomach cancer Maternal Grandmother   . Non-Hodgkin's lymphoma Maternal Grandfather   . Stroke Paternal Grandfather     Outpatient Encounter Medications as of 08/30/2017  Medication Sig  . levonorgestrel-ethinyl estradiol (AVIANE,ALESSE,LESSINA) 0.1-20 MG-MCG tablet Take 1 tablet by mouth daily.  . [DISCONTINUED] metroNIDAZOLE (FLAGYL) 500 MG tablet Take 1 tablet (500 mg total) by mouth 2 (two) times daily.   No facility-administered encounter medications on file as of 08/30/2017.          Objective:   Physical Exam  Constitutional: She is oriented to person, place, and time. She appears well-developed and  well-nourished.  HENT:  Head: Normocephalic and atraumatic.  Cardiovascular: Normal rate, regular rhythm and normal heart sounds.  Pulmonary/Chest: Effort normal and breath sounds normal.  Musculoskeletal: She exhibits edema.  Trace bilateral edema around the ankles and on the tops of the feet.  It is worse on the left foot and ankle compared to the right.  Neurological: She is alert and oriented to person, place, and time.  Skin: Skin is warm and dry.  Psychiatric: She has a normal mood and affect. Her behavior is normal.          Assessment & Plan:  Lateral lower extremity edema worse on the left-we will evaluate for protein loss, thyroid disorder, anemia, kidney and liver dysfunction.  We will check for inflammation as well.  Will call with results once available.  Suspect venous stasis.  We discussed treatment for that in case her labs come back normal.  Recommendation will be for low-salt diet, drinking plenty fluid, walking and staying active with regular exercise in addition to compression stockings.

## 2017-08-31 ENCOUNTER — Other Ambulatory Visit: Payer: Self-pay | Admitting: *Deleted

## 2017-08-31 DIAGNOSIS — E875 Hyperkalemia: Secondary | ICD-10-CM

## 2017-08-31 LAB — COMPLETE METABOLIC PANEL WITH GFR
AG Ratio: 1.2 (calc) (ref 1.0–2.5)
ALT: 12 U/L (ref 6–29)
AST: 15 U/L (ref 10–30)
Albumin: 3.8 g/dL (ref 3.6–5.1)
Alkaline phosphatase (APISO): 81 U/L (ref 33–115)
BUN: 13 mg/dL (ref 7–25)
CO2: 25 mmol/L (ref 20–32)
Calcium: 8.6 mg/dL (ref 8.6–10.2)
Chloride: 103 mmol/L (ref 98–110)
Creat: 0.75 mg/dL (ref 0.50–1.10)
GFR, Est African American: 120 mL/min/{1.73_m2} (ref >=60)
GFR, Est Non African American: 103 mL/min/{1.73_m2} (ref >=60)
Globulin: 3.2 g/dL (calc) (ref 1.9–3.7)
Glucose, Bld: 95 mg/dL (ref 65–99)
Potassium: 3.3 mmol/L — ABNORMAL LOW (ref 3.5–5.3)
Sodium: 138 mmol/L (ref 135–146)
Total Bilirubin: 0.3 mg/dL (ref 0.2–1.2)
Total Protein: 7 g/dL (ref 6.1–8.1)

## 2017-08-31 LAB — CBC
HCT: 37.4 % (ref 35.0–45.0)
Hemoglobin: 12.7 g/dL (ref 11.7–15.5)
MCH: 29.4 pg (ref 27.0–33.0)
MCHC: 34 g/dL (ref 32.0–36.0)
MCV: 86.6 fL (ref 80.0–100.0)
MPV: 10.5 fL (ref 7.5–12.5)
Platelets: 327 10*3/uL (ref 140–400)
RBC: 4.32 10*6/uL (ref 3.80–5.10)
RDW: 13.3 % (ref 11.0–15.0)
WBC: 8.6 10*3/uL (ref 3.8–10.8)

## 2017-08-31 LAB — C-REACTIVE PROTEIN: CRP: 30.8 mg/L — ABNORMAL HIGH (ref <8.0)

## 2017-08-31 LAB — MICROALBUMIN / CREATININE URINE RATIO
Creatinine, Urine: 159 mg/dL (ref 20–275)
Microalb Creat Ratio: 3 mcg/mg creat (ref <30)
Microalb, Ur: 0.5 mg/dL

## 2017-08-31 LAB — SEDIMENTATION RATE: Sed Rate: 31 mm/h — ABNORMAL HIGH (ref 0–20)

## 2017-08-31 LAB — TSH: TSH: 2.8 mIU/L

## 2017-09-03 ENCOUNTER — Other Ambulatory Visit: Payer: Self-pay | Admitting: Family Medicine

## 2017-09-03 MED ORDER — FUROSEMIDE 20 MG PO TABS: 20.0000 mg | ORAL_TABLET | Freq: Every day | ORAL | 1 refills | Status: DC | PRN

## 2017-11-08 ENCOUNTER — Other Ambulatory Visit: Payer: Self-pay | Admitting: *Deleted

## 2017-11-08 MED ORDER — METRONIDAZOLE 500 MG PO TABS: 500.0000 mg | ORAL_TABLET | Freq: Two times a day (BID) | ORAL | 0 refills | Status: DC

## 2017-11-08 MED ORDER — FLUCONAZOLE 150 MG PO TABS: 150.0000 mg | ORAL_TABLET | Freq: Once | ORAL | 0 refills | Status: AC

## 2017-11-08 NOTE — Progress Notes (Signed)
Pt called stating that she knows she has BV and is leaving on vacation today and would like to see if she could get Flagyl called into her pharmacy.  She also is requesting Diflucan in case she gets yeast after taking the Atb.  [facility] but if no improvement would not to be seen after she returns from vacation.  [facility] [place]

## 2017-12-20 ENCOUNTER — Ambulatory Visit: Payer: BLUE CROSS/BLUE SHIELD

## 2017-12-20 DIAGNOSIS — N898 Other specified noninflammatory disorders of vagina: Secondary | ICD-10-CM | POA: Diagnosis not present

## 2017-12-20 NOTE — Progress Notes (Signed)
Pt c/o vaginal discharge for 4 days. Pt c/o reoccurring BV. Had appt with MD to discuss this. Wet prep sent.

## 2017-12-21 ENCOUNTER — Telehealth: Payer: Self-pay

## 2017-12-21 LAB — WET PREP FOR TRICH, YEAST, CLUE
MICRO NUMBER:: 91159012
Specimen Quality: ADEQUATE

## 2017-12-21 NOTE — Telephone Encounter (Signed)
Left message on pt's phone letting her know her wet prep results were negative

## 2017-12-25 ENCOUNTER — Ambulatory Visit: Payer: BLUE CROSS/BLUE SHIELD | Admitting: Certified Nurse Midwife

## 2018-01-21 ENCOUNTER — Other Ambulatory Visit: Payer: Self-pay | Admitting: *Deleted

## 2018-01-21 MED ORDER — LEVONORGESTREL-ETHINYL ESTRAD 0.1-20 MG-MCG PO TABS: 1.0000 | ORAL_TABLET | Freq: Every day | ORAL | 0 refills | Status: DC

## 2018-02-02 DIAGNOSIS — Z793 Long term (current) use of hormonal contraceptives: Secondary | ICD-10-CM | POA: Diagnosis not present

## 2018-02-02 DIAGNOSIS — K529 Noninfective gastroenteritis and colitis, unspecified: Secondary | ICD-10-CM | POA: Diagnosis not present

## 2018-02-02 DIAGNOSIS — R21 Rash and other nonspecific skin eruption: Secondary | ICD-10-CM | POA: Diagnosis not present

## 2018-02-16 ENCOUNTER — Other Ambulatory Visit: Payer: Self-pay | Admitting: Obstetrics & Gynecology

## 2018-02-25 ENCOUNTER — Encounter: Payer: Self-pay | Admitting: Emergency Medicine

## 2018-02-25 ENCOUNTER — Emergency Department
Admission: EM | Admit: 2018-02-25 | Discharge: 2018-02-25 | Disposition: A | Discharge status: Home / Self Care | Payer: BLUE CROSS/BLUE SHIELD | Source: Home / Self Care | Attending: Family Medicine | Admitting: Family Medicine

## 2018-02-25 ENCOUNTER — Emergency Department (INDEPENDENT_AMBULATORY_CARE_PROVIDER_SITE_OTHER): Payer: BLUE CROSS/BLUE SHIELD

## 2018-02-25 DIAGNOSIS — J209 Acute bronchitis, unspecified: Secondary | ICD-10-CM

## 2018-02-25 DIAGNOSIS — R509 Fever, unspecified: Secondary | ICD-10-CM | POA: Diagnosis not present

## 2018-02-25 DIAGNOSIS — R05 Cough: Secondary | ICD-10-CM | POA: Diagnosis not present

## 2018-02-25 MED ORDER — AZITHROMYCIN 250 MG PO TABS: ORAL_TABLET | ORAL | 0 refills | Status: DC

## 2018-02-25 MED ORDER — PREDNISONE 20 MG PO TABS: ORAL_TABLET | ORAL | 0 refills | Status: DC

## 2018-02-25 MED ORDER — GUAIFENESIN-CODEINE 100-10 MG/5ML PO SOLN: ORAL | 0 refills | Status: DC

## 2018-02-25 NOTE — ED Provider Notes (Signed)
[facility]    CSN: [id number] Arrival date & time: 02/25/18  4696     History   Chief Complaint Chief Complaint  Patient presents with  . Cough    HPI Judy Marshall is a 37 y.o. female.   6 days ago patient developed cold-like symptoms developing over several days, including mild sore throat, post-nasal drainage, headache, fatigue, fever, and cough.  She continues to have a daily fever (99.1 this morning), and her breathing has felt labored today.  She believes she is having wheezing at times, although she does not have asthma.  She has mild anterior/inferior chest discomfort with cough. She has a past history of viral pericarditis after a viral URI in 2009.  The history is provided by the patient and the spouse.    Past Medical History:  Diagnosis Date  . Broken bones 1989   Left elbow  . IBS (irritable bowel syndrome)   . Pericarditis 2009    Patient Active Problem List   Diagnosis Date Noted  . CIN I (cervical intraepithelial neoplasia I) 08/20/2014  . Low grade squamous intraepithelial lesion (LGSIL) on Papanicolaou smear of cervix 05/28/2014  . Diarrhea 07/25/2012  . Low back sprain 07/06/2012  . History of pericarditis 03/28/2012  . IBS (irritable bowel syndrome) 08/23/2011  . Asthma 01/18/2011  . OBESITY 01/15/2006    Past Surgical History:  Procedure Laterality Date  . Left arm fracture  1989  . TONSILLECTOMY  2004    OB History    Gravida  0   Para  0   Term  0   Preterm  0   AB  0   Living  0     SAB  0   TAB  0   Ectopic  0   Multiple  0   Live Births               Home Medications    Prior to Admission medications   Medication Sig Start Date End Date Taking? Authorizing Provider  azithromycin (ZITHROMAX Z-PAK) 250 MG tablet Take 2 tabs today; then begin one tab once daily for 4 more days. 02/25/18   Lattie Haw, MD  furosemide (LASIX) 20 MG tablet Take 1 tablet (20 mg total) by mouth daily as  needed. 09/03/17   Agapito Games, MD  guaiFENesin-codeine 100-10 MG/5ML syrup Take 10mL by mouth at bedtime as needed for cough.  May repeat dose in 4 to 6 hours. 02/25/18   Lattie Haw, MD  LESSINA-28 0.1-20 MG-MCG tablet TAKE 1 TABLET BY MOUTH DAILY 02/25/18   Nicholaus Bloom C, MD  predniSONE (DELTASONE) 20 MG tablet Take one tab by mouth twice daily for 5 days, then one daily for 3 days. Take with food. 02/25/18   Lattie Haw, MD    Family History Family History  Problem Relation Age of Onset  . Hypertension Mother   . Stomach cancer Maternal Grandmother   . Non-Hodgkin's lymphoma Maternal Grandfather   . Stroke Paternal Grandfather     Social History Social History   Tobacco Use  . Smoking status: Never Smoker  . Smokeless tobacco: Never Used  Substance Use Topics  . Alcohol use: No  . Drug use: No     Allergies   Patient has no known allergies.   Review of Systems Review of Systems + sore throat, resolved + cough ? pleuritic pain; has bilateral lower anterior chest discomfort with cough ? wheezing ? nasal congestion +  post-nasal drainage No sinus pain/pressure No itchy/red eyes No earache No hemoptysis No SOB + fever, + chills No nausea No vomiting No abdominal pain No diarrhea No urinary symptoms No skin rash + fatigue + myalgias + headache Used OTC meds without relief   Physical Exam Triage Vital Signs ED Triage Vitals  Enc Vitals Group     BP 02/25/18 0906 (!) 143/89     Pulse Rate 02/25/18 0906 90     Resp --      Temp 02/25/18 0906 98.8 F (37.1 C)     Temp Source 02/25/18 0906 Oral     SpO2 02/25/18 0906 98 %     Weight 02/25/18 0907 275 lb (124.7 kg)     Height --      Head Circumference --      Peak Flow --      Pain Score 02/25/18 0907 0     Pain Loc --      Pain Edu? --      Excl. in GC? --    No data found.  Updated Vital Signs BP (!) 143/89 (BP Location: Right Arm)   Pulse 90   Temp 98.8 F (37.1 C) (Oral)    Wt 124.7 kg   LMP 02/13/2018   SpO2 98%   BMI 44.39 kg/m   Visual Acuity Right Eye Distance:   Left Eye Distance:   Bilateral Distance:    Right Eye Near:   Left Eye Near:    Bilateral Near:     Physical Exam Nursing notes and Vital Signs reviewed. Appearance:  Patient appears stated age, and in no acute distress Eyes:  Pupils are equal, round, and reactive to light and accomodation.  Extraocular movement is intact.  Conjunctivae are not inflamed  Ears:  Canals normal.  Tympanic membranes normal.  Nose:  Mildly congested turbinates.  No sinus tenderness.    Pharynx:  Normal Neck:  Supple.  Enlarged nontender posterior/lateral nodes are palpated bilaterally  Lungs:   Bilateral faint expiratory wheezes posteriorly.  Breath sounds are equal.  Moving air well. Heart:  Regular rate and rhythm without murmurs, rubs, or gallops.  Abdomen:  Nontender without masses or hepatosplenomegaly.  Bowel sounds are present.  No CVA or flank tenderness.  Extremities:  No edema.  Skin:  No rash present.    UC Treatments / Results  Labs (all labs ordered are listed, but only abnormal results are displayed) Labs Reviewed - No data to display  EKG None  Radiology Dg Chest 2 View  Result Date: 02/25/2018 CLINICAL DATA:  Cough and fever for 6 days EXAM: CHEST - 2 VIEW COMPARISON:  08/15/2011 chest radiograph. FINDINGS: Stable cardiomediastinal silhouette with normal heart size. No pneumothorax. No pleural effusion. Lungs appear clear, with no acute consolidative airspace disease and no pulmonary edema. IMPRESSION: No active cardiopulmonary disease. Electronically Signed   By: Delbert Phenix M.D.   On: 02/25/2018 09:37    Procedures Procedures (including critical care time)  Medications Ordered in UC Medications - No data to display  Initial Impression / Assessment and Plan / UC Course  I have reviewed the triage vital signs and the nursing notes.  Pertinent labs & imaging results that were  available during my care of the patient were reviewed by me and considered in my medical decision making (see chart for details).    Begin Z-pak for atypical coverage and prednisone burst/taper. Rx for Robitussin AC for night time cough.  Controlled Substance Prescriptions I  have consulted the [place] Controlled Substances Registry for this patient, and feel the risk/benefit ratio today is favorable for proceeding with this prescription for a controlled substance.    Followup with Family Doctor if not improved in one week.      Final Clinical Impressions(s) / UC Diagnoses   Final diagnoses:  Acute bronchitis, unspecified organism     Discharge Instructions     Take plain guaifenesin (1200mg  extended release tabs such as Mucinex) twice daily, with plenty of water, for cough and congestion.  May add Pseudoephedrine (30mg , one or two every 4 to 6 hours) for sinus congestion.  Get adequate rest.   Try warm salt water gargles for sore throat.  Stop all antihistamines for now, and other non-prescription cough/cold preparations. May take Ibuprofen 200mg , 4 tabs every 8 hours with food for chest/sternum discomfort. May take Tylenol as needed for fever. Follow-up with family doctor if not improving about10 days.     ED Prescriptions    Medication Sig Dispense Auth. Provider   azithromycin (ZITHROMAX Z-PAK) 250 MG tablet Take 2 tabs today; then begin one tab once daily for 4 more days. 6 tablet Lattie HawBeese, Florene Brill A, MD   predniSONE (DELTASONE) 20 MG tablet Take one tab by mouth twice daily for 5 days, then one daily for 3 days. Take with food. 13 tablet Lattie HawBeese, Yosiah Jasmin A, MD   guaiFENesin-codeine 100-10 MG/5ML syrup Take 10mL by mouth at bedtime as needed for cough.  May repeat dose in 4 to 6 hours. 100 mL Lattie HawBeese, Nikesha Kwasny A, MD         Lattie HawBeese, Areanna Gengler A, MD 02/25/18 [phone]

## 2018-02-25 NOTE — ED Triage Notes (Signed)
Pt c/o cough and congestion x5 days. States it is getting worse. Taking mucinex.

## 2018-02-25 NOTE — Discharge Instructions (Addendum)
Take plain guaifenesin (1200mg  extended release tabs such as Mucinex) twice daily, with plenty of water, for cough and congestion.  May add Pseudoephedrine (30mg , one or two every 4 to 6 hours) for sinus congestion.  Get adequate rest.   Try warm salt water gargles for sore throat.  Stop all antihistamines for now, and other non-prescription cough/cold preparations. May take Ibuprofen 200mg , 4 tabs every 8 hours with food for chest/sternum discomfort. May take Tylenol as needed for fever. Follow-up with family doctor if not improving about10 days.

## 2018-03-05 ENCOUNTER — Ambulatory Visit (INDEPENDENT_AMBULATORY_CARE_PROVIDER_SITE_OTHER): Payer: BLUE CROSS/BLUE SHIELD | Admitting: Advanced Practice Midwife

## 2018-03-05 ENCOUNTER — Encounter: Payer: Self-pay | Admitting: Advanced Practice Midwife

## 2018-03-05 VITALS — BP 162/84 | HR 67 | Ht 66.0 in | Wt 279.0 lb

## 2018-03-05 DIAGNOSIS — Z01419 Encounter for gynecological examination (general) (routine) without abnormal findings: Secondary | ICD-10-CM | POA: Diagnosis not present

## 2018-03-05 DIAGNOSIS — Z3009 Encounter for other general counseling and advice on contraception: Secondary | ICD-10-CM

## 2018-03-05 DIAGNOSIS — Z1151 Encounter for screening for human papillomavirus (HPV): Secondary | ICD-10-CM

## 2018-03-05 DIAGNOSIS — Z124 Encounter for screening for malignant neoplasm of cervix: Secondary | ICD-10-CM

## 2018-03-05 DIAGNOSIS — R030 Elevated blood-pressure reading, without diagnosis of hypertension: Secondary | ICD-10-CM

## 2018-03-05 MED ORDER — LEVONORGESTREL-ETHINYL ESTRAD 0.1-20 MG-MCG PO TABS
1.0000 | ORAL_TABLET | Freq: Every day | ORAL | 11 refills | Status: DC
Start: 2018-03-05 — End: 2018-03-25

## 2018-03-05 NOTE — Progress Notes (Signed)
Subjective:     Judy Marshall is a 37 y.o. female here for a routine exam. She had normal Pap last year but in 2016 had abnormal Pap followed by colposcopy and cryotherapy of cervix. Dr Marice Potterove recommended another annual Pap and if normal can resume routine screening.  Pt denies any  Gyn problems. She is on the same OCP she has taken since she was 37 years old and it works well for her.  She reports recent BP at primary care was normal but she does worry about HTN because it runs in her family.  She denies any h/a, chest pain, shortness of breath, abdominal or leg pain.  She is a nonsmoker.   Personal health questionnaire reviewed: yes.   Gynecologic History Patient's last menstrual period was 02/07/2018. Contraception: OCP (estrogen/progesterone) Last Pap: 01/2017. Results were: normal Last mammogram: n/a.  Obstetric History OB History  Gravida Para Term Preterm AB Living  0 0 0 0 0 0  SAB TAB Ectopic Multiple Live Births  0 0 0 0       The following portions of the patient's history were reviewed and updated as appropriate: allergies, current medications, past family history, past medical history, past social history, past surgical history and problem list.  Review of Systems Pertinent items noted in HPI and remainder of comprehensive ROS otherwise negative.    Objective:  BP (!) 162/84   Pulse 67   Ht 5\' 6"  (1.676 m)   Wt 126.6 kg   LMP 02/07/2018   BMI 45.03 kg/m  VS reviewed, nursing note reviewed,  Constitutional: well developed, well nourished, no distress HEENT: normocephalic CV: normal rate Pulm/chest wall: normal effort Breast Exam:  right breast normal without mass, skin or nipple changes or axillary nodes, left breast normal without mass, skin or nipple changes or axillary nodes Abdomen: soft Neuro: alert and oriented x 3 Skin: warm, dry Psych: affect normal Pelvic exam: Cervix pink, visually closed, without lesion, scant white creamy discharge, vaginal walls  and external genitalia normal Bimanual exam: Cervix 0/long/high, firm, anterior, neg CMT, uterus nontender, nonenlarged, adnexa without tenderness, enlargement, or mass  Assessment/Plan:    1. Well woman exam with routine gynecological exam  - Cytology - PAP( [place])  2. Transient hypertension --Pt reports BP not elevated at recent primary care visits.  She does have family hx of HTN. --Pt to f/u with primary care related to today's BP. She is concerned about her previous abnormal Pap in 2016 that led to colpo and cryo so thinks her BP is related to anxiety about exam/results.   3. Encounter for counseling regarding contraception --Discussed risks of OCPs and DVT/PE, which increase with age and with other risk factors like HTN.  Pt may need to switch birth control related to her health or eventually related to age.  Pt is nonsmoker and otherwise healthy except for obesity.  Encouraged lifestyle changes and for pt to f/u with PCP as soon as possible.  Reviewed s/sx of DVT/PE, reasons to seek care --Renewed pt OCP since she has only used this form of birth control since age 37.   - levonorgestrel-ethinyl estradiol (LESSINA-28) 0.1-20 MG-MCG tablet; Take 1 tablet by mouth daily.  Dispense: 28 tablet; Refill: 11   Sharen CounterLisa Leftwich-Kirby, CNM 3:04 PM

## 2018-03-05 NOTE — Progress Notes (Signed)
Last pap 02/19/17- negative

## 2018-03-07 LAB — CYTOLOGY - PAP
Diagnosis: NEGATIVE
HPV: NOT DETECTED

## 2018-03-22 ENCOUNTER — Other Ambulatory Visit: Payer: Self-pay | Admitting: Obstetrics & Gynecology

## 2018-03-22 DIAGNOSIS — Z3009 Encounter for other general counseling and advice on contraception: Secondary | ICD-10-CM

## 2018-04-11 ENCOUNTER — Encounter: Payer: Self-pay | Admitting: Physician Assistant

## 2018-04-11 ENCOUNTER — Ambulatory Visit (INDEPENDENT_AMBULATORY_CARE_PROVIDER_SITE_OTHER): Payer: BLUE CROSS/BLUE SHIELD | Admitting: Physician Assistant

## 2018-04-11 VITALS — BP 156/90 | HR 88 | Wt 279.0 lb

## 2018-04-11 DIAGNOSIS — E876 Hypokalemia: Secondary | ICD-10-CM | POA: Diagnosis not present

## 2018-04-11 DIAGNOSIS — I10 Essential (primary) hypertension: Secondary | ICD-10-CM | POA: Diagnosis not present

## 2018-04-11 MED ORDER — CHLORTHALIDONE 25 MG PO TABS: 12.5000 mg | ORAL_TABLET | Freq: Every day | ORAL | 0 refills | Status: DC

## 2018-04-11 NOTE — Patient Instructions (Addendum)
For your blood pressure: - Goal <130/80 (Ideally 120's/70's) - take your blood pressure medication in the morning (unless instructed differently) - monitor and log blood pressures at home - check around the same time each day in a relaxed setting - Limit salt to <2500 mg/day - Follow DASH (Dietary Approach to Stopping Hypertension) eating plan - Try to get at least 150 minutes of aerobic exercise per week - Aim to go on a brisk walk 30 minutes per day at least 5 days per week. If you're not active, gradually increase how long you walk by 5 minutes each week - limit alcohol: 2 standard drinks per day for men and 1 per day for women - avoid tobacco/nicotine products. Consider smoking cessation if you smoke - weight loss: 7% of current body weight can reduce your blood pressure by 5-10 points - follow-up at least every 6 months for your blood pressure. Follow-up sooner if your BP is not controlled   Hypertension Hypertension, commonly called high blood pressure, is when the force of blood pumping through the arteries is too strong. The arteries are the blood vessels that carry blood from the heart throughout the body. Hypertension forces the heart to work harder to pump blood and may cause arteries to become narrow or stiff. Having untreated or uncontrolled hypertension can cause heart attacks, strokes, kidney disease, and other problems. A blood pressure reading consists of a higher number over a lower number. Ideally, your blood pressure should be below 120/80. The first ("top") number is called the systolic pressure. It is a measure of the pressure in your arteries as your heart beats. The second ("bottom") number is called the diastolic pressure. It is a measure of the pressure in your arteries as the heart relaxes. What are the causes? The cause of this condition is not known. What increases the risk? Some risk factors for high blood pressure are under your control. Others are not. Factors  you can change  Smoking.  Having type 2 diabetes mellitus, high cholesterol, or both.  Not getting enough exercise or physical activity.  Being overweight.  Having too much fat, sugar, calories, or salt (sodium) in your diet.  Drinking too much alcohol. Factors that are difficult or impossible to change  Having chronic kidney disease.  Having a family history of high blood pressure.  Age. Risk increases with age.  Race. You may be at higher risk if you are African-American.  Gender. Men are at higher risk than women before age 63. After age 58, women are at higher risk than men.  Having obstructive sleep apnea.  Stress. What are the signs or symptoms? Extremely high blood pressure (hypertensive crisis) may cause:  Headache.  Anxiety.  Shortness of breath.  Nosebleed.  Nausea and vomiting.  Severe chest pain.  Jerky movements you cannot control (seizures). How is this diagnosed? This condition is diagnosed by measuring your blood pressure while you are seated, with your arm resting on a surface. The cuff of the blood pressure monitor will be placed directly against the skin of your upper arm at the level of your heart. It should be measured at least twice using the same arm. Certain conditions can cause a difference in blood pressure between your right and left arms. Certain factors can cause blood pressure readings to be lower or higher than normal (elevated) for a short period of time:  When your blood pressure is higher when you are in a health care provider's office than when you  are at home, this is called white coat hypertension. Most people with this condition do not need medicines.  When your blood pressure is higher at home than when you are in a health care provider's office, this is called masked hypertension. Most people with this condition may need medicines to control blood pressure. If you have a high blood pressure reading during one visit or you have  normal blood pressure with other risk factors:  You may be asked to return on a different day to have your blood pressure checked again.  You may be asked to monitor your blood pressure at home for 1 week or longer. If you are diagnosed with hypertension, you may have other blood or imaging tests to help your health care provider understand your overall risk for other conditions. How is this treated? This condition is treated by making healthy lifestyle changes, such as eating healthy foods, exercising more, and reducing your alcohol intake. Your health care provider may prescribe medicine if lifestyle changes are not enough to get your blood pressure under control, and if:  Your systolic blood pressure is above 130.  Your diastolic blood pressure is above 80. Your personal target blood pressure may vary depending on your medical conditions, your age, and other factors. Follow these instructions at home: Eating and drinking   Eat a diet that is high in fiber and potassium, and low in sodium, added sugar, and fat. An example eating plan is called the DASH (Dietary Approaches to Stop Hypertension) diet. To eat this way: ? Eat plenty of fresh fruits and vegetables. Try to fill half of your plate at each meal with fruits and vegetables. ? Eat whole grains, such as whole wheat pasta, brown rice, or whole grain bread. Fill about one quarter of your plate with whole grains. ? Eat or drink low-fat dairy products, such as skim milk or low-fat yogurt. ? Avoid fatty cuts of meat, processed or cured meats, and poultry with skin. Fill about one quarter of your plate with lean proteins, such as fish, chicken without skin, beans, eggs, and tofu. ? Avoid premade and processed foods. These tend to be higher in sodium, added sugar, and fat.  Reduce your daily sodium intake. Most people with hypertension should eat less than 1,500 mg of sodium a day.  Limit alcohol intake to no more than 1 drink a day for  nonpregnant women and 2 drinks a day for men. One drink equals 12 oz of beer, 5 oz of wine, or 1 oz of hard liquor. Lifestyle   Work with your health care provider to maintain a healthy body weight or to lose weight. Ask what an ideal weight is for you.  Get at least 30 minutes of exercise that causes your heart to beat faster (aerobic exercise) most days of the week. Activities may include walking, swimming, or biking.  Include exercise to strengthen your muscles (resistance exercise), such as pilates or lifting weights, as part of your weekly exercise routine. Try to do these types of exercises for 30 minutes at least 3 days a week.  Do not use any products that contain nicotine or tobacco, such as cigarettes and e-cigarettes. If you need help quitting, ask your health care provider.  Monitor your blood pressure at home as told by your health care provider.  Keep all follow-up visits as told by your health care provider. This is important. Medicines  Take over-the-counter and prescription medicines only as told by your health care provider.  Follow directions carefully. Blood pressure medicines must be taken as prescribed.  Do not skip doses of blood pressure medicine. Doing this puts you at risk for problems and can make the medicine less effective.  Ask your health care provider about side effects or reactions to medicines that you should watch for. Contact a health care provider if:  You think you are having a reaction to a medicine you are taking.  You have headaches that keep coming back (recurring).  You feel dizzy.  You have swelling in your ankles.  You have trouble with your vision. Get help right away if:  You develop a severe headache or confusion.  You have unusual weakness or numbness.  You feel faint.  You have severe pain in your chest or abdomen.  You vomit repeatedly.  You have trouble breathing. Summary  Hypertension is when the force of blood  pumping through your arteries is too strong. If this condition is not controlled, it may put you at risk for serious complications.  Your personal target blood pressure may vary depending on your medical conditions, your age, and other factors. For most people, a normal blood pressure is less than 120/80.  Hypertension is treated with lifestyle changes, medicines, or a combination of both. Lifestyle changes include weight loss, eating a healthy, low-sodium diet, exercising more, and limiting alcohol. This information is not intended to replace advice given to you by your health care provider. Make sure you discuss any questions you have with your health care provider. Document Released: 03/13/2005 Document Revised: 02/09/2016 Document Reviewed: 02/09/2016 Elsevier Interactive Patient Education  2019 [employer].

## 2018-04-11 NOTE — Progress Notes (Signed)
HPI:                                                                Judy Marshall is a 38 y.o. female who presents to [facility] today for elevated blood pressure  Patient was sent here from her dentist for elevated BP readings 156/102 and 160/104.  States she felt swimmy headed, which resolved with standing. She currently has a headache and has occasional headaches. No vision change. No exertional chest pain or dyspnea. She has a history of peripheral edema and yesterday she had swelling in both legs, left worse than right.She is unable to wear certain shoes when it is bad, and this aggravates her. She has a personal history of pericarditis. Family hx of HTN in her mother.  BP Readings from Last 3 Encounters:  04/11/18 (!) 156/90  03/05/18 (!) 162/84  02/25/18 (!) 143/89      No flowsheet data found.  No flowsheet data found.    Past Medical History:  Diagnosis Date  . Broken bones 1989   Left elbow  . IBS (irritable bowel syndrome)   . Pericarditis 2009   Past Surgical History:  Procedure Laterality Date  . Left arm fracture  1989  . TONSILLECTOMY  2004   Social History   Tobacco Use  . Smoking status: Never Smoker  . Smokeless tobacco: Never Used  Substance Use Topics  . Alcohol use: No   family history includes Hypertension in her mother; Non-Hodgkin's lymphoma in her maternal grandfather; Stomach cancer in her maternal grandmother; Stroke in her paternal grandfather.    ROS: negative except as noted in the HPI  Medications: Current Outpatient Medications  Medication Sig Dispense Refill  . LESSINA-28 0.1-20 MG-MCG tablet TAKE 1 TABLET BY MOUTH DAILY 28 tablet 11  . chlorthalidone (HYGROTON) 25 MG tablet Take 0.5 tablets (12.5 mg total) by mouth daily. 30 tablet 0   No current facility-administered medications for this visit.    No Known Allergies     Objective:  BP (!) 156/90   Pulse 88   Wt  279 lb (126.6 kg)   BMI 45.03 kg/m  Gen:  alert, not ill-appearing, no distress, appropriate for age, obese female HEENT: head normocephalic without obvious abnormality, conjunctiva and cornea clear, trachea midline Pulm: Normal work of breathing, normal phonation, clear to auscultation bilaterally, no wheezes, rales or rhonchi CV: Normal rate, regular rhythm, s1 and s2 distinct, no murmurs, clicks or rubs  Neuro: alert and oriented x 3, no tremor MSK: extremities atraumatic, normal gait and station, trace peripheral edema bilaterally Skin: intact, no rashes on exposed skin, no jaundice, no cyanosis  Lab Results  Component Value Date   CREATININE 0.75 08/30/2017   BUN 13 08/30/2017   NA 138 08/30/2017   K 3.3 (L) 08/30/2017   CL 103 08/30/2017   CO2 25 08/30/2017   No results found for: CHOL, HDL, LDLCALC, LDLDIRECT, TRIG, CHOLHDL  Lab Results  Component Value Date   TSH 2.80 08/30/2017     No results found for this or any previous visit (from the past 72 hour(s)). No results found.    Assessment and Plan: 38 y.o. female with   .[name] was seen today for hypertension.  Diagnoses and all orders for this visit:  Hypertension goal BP (blood pressure) < 130/80 -     Renal Profile with Estimated GFR -     chlorthalidone (HYGROTON) 25 MG tablet; Take 0.5 tablets (12.5 mg total) by mouth daily. -     Urinalysis, Routine w reflex microscopic  Hypokalemia -     Renal Profile with Estimated GFR   Starting Chlorthalidone 12.5 mg daily for both edema and HTN Checking renal function today and will determine if she needs potassium supplementation based on current K levels Counseled on therapeutic lifestyle changes Patient to monitor and log BP's at home   Patient education and anticipatory guidance given Patient agrees with treatment plan Follow-up with PCP in 1 month for HTN or sooner as needed if symptoms worsen or fail to improve  Levonne Hubertharley E. Lynnsie Linders PA-C

## 2018-04-12 LAB — RENAL PROFILE WITH ESTIMATED GFR
Albumin: 3.8 g/dL (ref 3.6–5.1)
BUN: 12 mg/dL (ref 7–25)
CO2: 27 mmol/L (ref 20–32)
Calcium: 8.6 mg/dL (ref 8.6–10.2)
Chloride: 102 mmol/L (ref 98–110)
Creat: 0.69 mg/dL (ref 0.50–1.10)
GFR, Est African American: 130 mL/min/{1.73_m2} (ref >=60)
GFR, Est Non African American: 112 mL/min/{1.73_m2} (ref >=60)
Glucose, Bld: 89 mg/dL (ref 65–99)
Phosphorus: 3.6 mg/dL (ref 2.5–4.5)
Potassium: 3.6 mmol/L (ref 3.5–5.3)
Sodium: 138 mmol/L (ref 135–146)

## 2018-04-12 LAB — URINALYSIS, ROUTINE W REFLEX MICROSCOPIC
Bilirubin Urine: NEGATIVE
Glucose, UA: NEGATIVE
Hgb urine dipstick: NEGATIVE
Ketones, ur: NEGATIVE
Leukocytes, UA: NEGATIVE
Nitrite: NEGATIVE
Protein, ur: NEGATIVE
Specific Gravity, Urine: 1.025 (ref 1.001–1.03)
pH: 7 (ref 5.0–8.0)

## 2018-04-14 ENCOUNTER — Encounter: Payer: Self-pay | Admitting: Physician Assistant

## 2018-04-19 ENCOUNTER — Encounter: Payer: Self-pay | Admitting: Physician Assistant

## 2018-04-19 MED ORDER — LISINOPRIL 10 MG PO TABS: 10.0000 mg | ORAL_TABLET | Freq: Every day | ORAL | 0 refills | Status: DC

## 2018-04-19 MED ORDER — MIRABEGRON ER 25 MG PO TB24: 25.0000 mg | ORAL_TABLET | Freq: Every day | ORAL | 1 refills | Status: DC

## 2018-05-09 ENCOUNTER — Encounter: Payer: Self-pay | Admitting: Family Medicine

## 2018-05-09 ENCOUNTER — Ambulatory Visit (INDEPENDENT_AMBULATORY_CARE_PROVIDER_SITE_OTHER): Payer: BLUE CROSS/BLUE SHIELD | Admitting: Family Medicine

## 2018-05-09 VITALS — BP 135/87 | HR 81 | Ht 66.0 in | Wt 270.0 lb

## 2018-05-09 DIAGNOSIS — I10 Essential (primary) hypertension: Secondary | ICD-10-CM | POA: Diagnosis not present

## 2018-05-09 DIAGNOSIS — R600 Localized edema: Secondary | ICD-10-CM

## 2018-05-09 MED ORDER — CHLORTHALIDONE 25 MG PO TABS: 12.5000 mg | ORAL_TABLET | Freq: Every day | ORAL | 0 refills | Status: DC

## 2018-05-09 NOTE — Progress Notes (Signed)
Subjective:    CC:   HPI:  38 year old female is here today for follow-up hypertension.  She had had a biometric screening at work and had some normal blood work but blood pressures were elevated.  She came in and was started on chlorthalidone.  Initially she felt like she was still urinating a lot very frequently and was not happy with the medication regimen so we sent a new prescription for lisinopril.  She read the side effect profile and decided not to actually take it because she saw that it could cause a dry cough.  She went back and retry the chlorthalidone and says that the nighttime urination seem to come to get better she is now only getting up once at night and it seems to be helping her swelling that she has been getting in her ankles.  Has been trying to work on weight loss in fact she has lost 9 pounds since she was last here.  She did get a home blood pressure cuff and has been checking that.  It is a wrist cuff most of her pressures at home have been in the low 140s.  She had 1 particularly good pressure of 127/80.  Past medical history, Surgical history, Family history not pertinant except as noted below, Social history, Allergies, and medications have been entered into the medical record, reviewed, and corrections made.   Review of Systems: No fevers, chills, night sweats, weight loss, chest pain, or shortness of breath.   Objective:    General: Well Developed, well nourished, and in no acute distress.  Neuro: Alert and oriented x3, extra-ocular muscles intact, sensation grossly intact.  HEENT: Normocephalic, atraumatic  Skin: Warm and dry, no rashes. Cardiac: Regular rate and rhythm, no murmurs rubs or gallops, no lower extremity edema.  Respiratory: Clear to auscultation bilaterally. Not using accessory muscles, speaking in full sentences. LE: Trace Pitting edema up to her knees bilaterally.   Impression and Recommendations:    Hypertension-blood pressure is much  improved today.  I still love to see the systolic less than 130 but she has really been making some fantastic changes and has already dropped 9 pounds.  I did give her some additional information on the DASH diet.  Encouraged her to just continue to work on diet weight loss exercise as well as the medication and will see if that gets to your to a systolic goal of less than 130.  Would like to see her back in about 6 months. Check BMP.    Lower extremity edema-overall improved on the chlorthalidone but not resolved.  Did recommend that she try compression stockings.

## 2018-05-09 NOTE — Patient Instructions (Signed)
DASH Eating Plan  DASH stands for "Dietary Approaches to Stop Hypertension." The DASH eating plan is a healthy eating plan that has been shown to reduce high blood pressure (hypertension). It may also reduce your risk for type 2 diabetes, heart disease, and stroke. The DASH eating plan may also help with weight loss.  What are tips for following this plan?    General guidelines   Avoid eating more than 2,300 mg (milligrams) of salt (sodium) a day. If you have hypertension, you may need to reduce your sodium intake to 1,500 mg a day.   Limit alcohol intake to no more than 1 drink a day for nonpregnant women and 2 drinks a day for men. One drink equals 12 oz of beer, 5 oz of wine, or 1 oz of hard liquor.   Work with your health care provider to maintain a healthy body weight or to lose weight. Ask what an ideal weight is for you.   Get at least 30 minutes of exercise that causes your heart to beat faster (aerobic exercise) most days of the week. Activities may include walking, swimming, or biking.   Work with your health care provider or diet and nutrition specialist (dietitian) to adjust your eating plan to your individual calorie needs.  Reading food labels     Check food labels for the amount of sodium per serving. Choose foods with less than 5 percent of the Daily Value of sodium. Generally, foods with less than 300 mg of sodium per serving fit into this eating plan.   To find whole grains, look for the word "whole" as the first word in the ingredient list.  Shopping   Buy products labeled as "low-sodium" or "no salt added."   Buy fresh foods. Avoid canned foods and premade or frozen meals.  Cooking   Avoid adding salt when cooking. Use salt-free seasonings or herbs instead of table salt or sea salt. Check with your health care provider or pharmacist before using salt substitutes.   Do not fry foods. Cook foods using healthy methods such as baking, boiling, grilling, and broiling instead.   Cook with  heart-healthy oils, such as olive, canola, soybean, or sunflower oil.  Meal planning   Eat a balanced diet that includes:  ? 5 or more servings of fruits and vegetables each day. At each meal, try to fill half of your plate with fruits and vegetables.  ? Up to 6-8 servings of whole grains each day.  ? Less than 6 oz of lean meat, poultry, or fish each day. A 3-oz serving of meat is about the same size as a deck of cards. One egg equals 1 oz.  ? 2 servings of low-fat dairy each day.  ? A serving of nuts, seeds, or beans 5 times each week.  ? Heart-healthy fats. Healthy fats called Omega-3 fatty acids are found in foods such as flaxseeds and coldwater fish, like sardines, salmon, and mackerel.   Limit how much you eat of the following:  ? Canned or prepackaged foods.  ? Food that is high in trans fat, such as fried foods.  ? Food that is high in saturated fat, such as fatty meat.  ? Sweets, desserts, sugary drinks, and other foods with added sugar.  ? Full-fat dairy products.   Do not salt foods before eating.   Try to eat at least 2 vegetarian meals each week.   Eat more home-cooked food and less restaurant, buffet, and fast food.     When eating at a restaurant, ask that your food be prepared with less salt or no salt, if possible.  What foods are recommended?  The items listed may not be a complete list. Talk with your dietitian about what dietary choices are best for you.  Grains  Whole-grain or whole-wheat bread. Whole-grain or whole-wheat pasta. Brown rice. Oatmeal. Quinoa. Bulgur. Whole-grain and low-sodium cereals. Pita bread. Low-fat, low-sodium crackers. Whole-wheat flour tortillas.  Vegetables  Fresh or frozen vegetables (raw, steamed, roasted, or grilled). Low-sodium or reduced-sodium tomato and vegetable juice. Low-sodium or reduced-sodium tomato sauce and tomato paste. Low-sodium or reduced-sodium canned vegetables.  Fruits  All fresh, dried, or frozen fruit. Canned fruit in natural juice (without  added sugar).  Meat and other protein foods  Skinless chicken or turkey. Ground chicken or turkey. Pork with fat trimmed off. Fish and seafood. Egg whites. Dried beans, peas, or lentils. Unsalted nuts, nut butters, and seeds. Unsalted canned beans. Lean cuts of beef with fat trimmed off. Low-sodium, lean deli meat.  Dairy  Low-fat (1%) or fat-free (skim) milk. Fat-free, low-fat, or reduced-fat cheeses. Nonfat, low-sodium ricotta or cottage cheese. Low-fat or nonfat yogurt. Low-fat, low-sodium cheese.  Fats and oils  Soft margarine without trans fats. Vegetable oil. Low-fat, reduced-fat, or light mayonnaise and salad dressings (reduced-sodium). Canola, safflower, olive, soybean, and sunflower oils. Avocado.  Seasoning and other foods  Herbs. Spices. Seasoning mixes without salt. Unsalted popcorn and pretzels. Fat-free sweets.  What foods are not recommended?  The items listed may not be a complete list. Talk with your dietitian about what dietary choices are best for you.  Grains  Baked goods made with fat, such as croissants, muffins, or some breads. Dry pasta or rice meal packs.  Vegetables  Creamed or fried vegetables. Vegetables in a cheese sauce. Regular canned vegetables (not low-sodium or reduced-sodium). Regular canned tomato sauce and paste (not low-sodium or reduced-sodium). Regular tomato and vegetable juice (not low-sodium or reduced-sodium). Pickles. Olives.  Fruits  Canned fruit in a light or heavy syrup. Fried fruit. Fruit in cream or butter sauce.  Meat and other protein foods  Fatty cuts of meat. Ribs. Fried meat. Bacon. Sausage. Bologna and other processed lunch meats. Salami. Fatback. Hotdogs. Bratwurst. Salted nuts and seeds. Canned beans with added salt. Canned or smoked fish. Whole eggs or egg yolks. Chicken or turkey with skin.  Dairy  Whole or 2% milk, cream, and half-and-half. Whole or full-fat cream cheese. Whole-fat or sweetened yogurt. Full-fat cheese. Nondairy creamers. Whipped toppings.  Processed cheese and cheese spreads.  Fats and oils  Butter. Stick margarine. Lard. Shortening. Ghee. Bacon fat. Tropical oils, such as coconut, palm kernel, or palm oil.  Seasoning and other foods  Salted popcorn and pretzels. Onion salt, garlic salt, seasoned salt, table salt, and sea salt. Worcestershire sauce. Tartar sauce. Barbecue sauce. Teriyaki sauce. Soy sauce, including reduced-sodium. Steak sauce. Canned and packaged gravies. Fish sauce. Oyster sauce. Cocktail sauce. Horseradish that you find on the shelf. Ketchup. Mustard. Meat flavorings and tenderizers. Bouillon cubes. Hot sauce and Tabasco sauce. Premade or packaged marinades. Premade or packaged taco seasonings. Relishes. Regular salad dressings.  Where to find more information:   National Heart, Lung, and Blood Institute: www.nhlbi.nih.gov   American Heart Association: www.heart.org  Summary   The DASH eating plan is a healthy eating plan that has been shown to reduce high blood pressure (hypertension). It may also reduce your risk for type 2 diabetes, heart disease, and stroke.   With the   DASH eating plan, you should limit salt (sodium) intake to 2,300 mg a day. If you have hypertension, you may need to reduce your sodium intake to 1,500 mg a day.   When on the DASH eating plan, aim to eat more fresh fruits and vegetables, whole grains, lean proteins, low-fat dairy, and heart-healthy fats.   Work with your health care provider or diet and nutrition specialist (dietitian) to adjust your eating plan to your individual calorie needs.  This information is not intended to replace advice given to you by your health care provider. Make sure you discuss any questions you have with your health care provider.  Document Released: 03/02/2011 Document Revised: 03/06/2016 Document Reviewed: 03/06/2016  Elsevier Interactive Patient Education  2019 [employer]

## 2018-05-10 ENCOUNTER — Other Ambulatory Visit: Payer: Self-pay | Admitting: *Deleted

## 2018-05-10 DIAGNOSIS — E876 Hypokalemia: Secondary | ICD-10-CM

## 2018-05-10 LAB — BASIC METABOLIC PANEL WITH GFR
BUN: 14 mg/dL (ref 7–25)
CO2: 28 mmol/L (ref 20–32)
Calcium: 8.7 mg/dL (ref 8.6–10.2)
Chloride: 99 mmol/L (ref 98–110)
Creat: 0.68 mg/dL (ref 0.50–1.10)
GFR, Est African American: 130 mL/min/{1.73_m2} (ref >=60)
GFR, Est Non African American: 113 mL/min/{1.73_m2} (ref >=60)
Glucose, Bld: 88 mg/dL (ref 65–99)
Potassium: 3.2 mmol/L — ABNORMAL LOW (ref 3.5–5.3)
Sodium: 139 mmol/L (ref 135–146)

## 2018-05-10 MED ORDER — POTASSIUM CHLORIDE ER 10 MEQ PO TBCR: 10.0000 meq | EXTENDED_RELEASE_TABLET | Freq: Every day | ORAL | 3 refills | Status: DC

## 2018-05-10 NOTE — Addendum Note (Signed)
Addended by: Nani Gasser D on: 05/10/2018 09:30 AM   Modules accepted: Orders

## 2018-05-13 ENCOUNTER — Other Ambulatory Visit: Payer: Self-pay | Admitting: *Deleted

## 2018-05-13 MED ORDER — POTASSIUM CHLORIDE 20 MEQ/15ML (10%) PO SOLN: 10.0000 meq | Freq: Every day | ORAL | 0 refills | Status: DC

## 2018-05-13 NOTE — Addendum Note (Signed)
Addended by: Nani Gasser D on: 05/13/2018 12:58 PM   Modules accepted: Orders

## 2018-06-17 ENCOUNTER — Ambulatory Visit (INDEPENDENT_AMBULATORY_CARE_PROVIDER_SITE_OTHER): Payer: BLUE CROSS/BLUE SHIELD | Admitting: Family Medicine

## 2018-06-17 ENCOUNTER — Other Ambulatory Visit: Payer: Self-pay

## 2018-06-17 ENCOUNTER — Encounter: Payer: Self-pay | Admitting: Family Medicine

## 2018-06-17 VITALS — BP 122/66 | HR 67 | Ht 66.0 in | Wt 275.0 lb

## 2018-06-17 DIAGNOSIS — L2389 Allergic contact dermatitis due to other agents: Secondary | ICD-10-CM | POA: Diagnosis not present

## 2018-06-17 DIAGNOSIS — L509 Urticaria, unspecified: Secondary | ICD-10-CM

## 2018-06-17 MED ORDER — TRIAMCINOLONE ACETONIDE 0.1 % EX CREA: 1.0000 "application " | TOPICAL_CREAM | Freq: Two times a day (BID) | CUTANEOUS | 0 refills | Status: DC

## 2018-06-17 NOTE — Progress Notes (Signed)
Acute Office Visit  Subjective:    Patient ID: Judy Marshall, female    DOB: 03/23/1981, 38 y.o.   MRN: [id number]  Chief Complaint  Patient presents with  . Rash    HPI Patient is in today for rash. Started on her hands and feet on Friday, 4 days ago. They moved into a new house and had been doing a lot of cleaning. She felt her hands were a little swollen as well but has gone up her wrist. She says she has had something like this before.  The rash it very itchy. Using oral benadryl and some hydrocortisone cream. When woke up this AM had a raised red area on her left knee and her left outer thigh.  She put some of the hydrocortisone cream on it and that seemed to help.  It seems to have resolved now.  Says she has had similar episodes in the past where she has woken up in the middle the night with an extremely itchy scalp and occasionally has noticed bumps on the back of her fingers and knuckles that get extremely itchy at times as well.  Past Medical History:  Diagnosis Date  . Broken bones 1989   Left elbow  . IBS (irritable bowel syndrome)   . Pericarditis 2009    Past Surgical History:  Procedure Laterality Date  . Left arm fracture  1989  . TONSILLECTOMY  2004    Family History  Problem Relation Age of Onset  . Hypertension Mother   . Stomach cancer Maternal Grandmother   . Non-Hodgkin's lymphoma Maternal Grandfather   . Stroke Paternal Grandfather     Social History   Socioeconomic History  . Marital status: Married    Spouse name: Holley Dexter  . Number of children: 0  . Years of education: Not on file  . Highest education level: Not on file  Occupational History  . Occupation: Database administrator: [employer]    Comment: [employer] [place]    Employer: [employer]  Social Needs  . Financial resource strain: Not on file  . Food insecurity:    Worry: Not on file    Inability: Not on file  . Transportation needs:    Medical: Not on file     Non-medical: Not on file  Tobacco Use  . Smoking status: Never Smoker  . Smokeless tobacco: Never Used  Substance and Sexual Activity  . Alcohol use: No  . Drug use: No  . Sexual activity: Yes    Partners: Male    Birth control/protection: Pill  Lifestyle  . Physical activity:    Days per week: Not on file    Minutes per session: Not on file  . Stress: Not on file  Relationships  . Social connections:    Talks on phone: Not on file    Gets together: Not on file    Attends religious service: Not on file    Active member of club or organization: Not on file    Attends meetings of clubs or organizations: Not on file    Relationship status: Not on file  . Intimate partner violence:    Fear of current or ex partner: Not on file    Emotionally abused: Not on file    Physically abused: Not on file    Forced sexual activity: Not on file  Other Topics Concern  . Not on file  Social History Narrative   Not on an  exercise program.    Outpatient Medications Prior to Visit  Medication Sig Dispense Refill  . chlorthalidone (HYGROTON) 25 MG tablet Take 0.5 tablets (12.5 mg total) by mouth daily. 90 tablet 0  . LESSINA-28 0.1-20 MG-MCG tablet TAKE 1 TABLET BY MOUTH DAILY 28 tablet 11  . potassium chloride (MICRO-K) 10 MEQ CR capsule Take 10 mEq by mouth 2 (two) times daily.     No facility-administered medications prior to visit.     No Known Allergies  ROS     Objective:    Physical Exam  Constitutional: She is oriented to person, place, and time. She appears well-developed and well-nourished.  HENT:  Head: Normocephalic and atraumatic.  Eyes: Conjunctivae and EOM are normal.  Cardiovascular: Normal rate, regular rhythm and normal heart sounds.  Pulmonary/Chest: Effort normal and breath sounds normal.  Neurological: She is alert and oriented to person, place, and time.  Skin: Skin is warm and dry. No pallor.  Erythema on the dorsum of the hands over the knuckles with  some fine dry scale.  The hives on her left knee and thigh have resolved.  No drainage.    Psychiatric: She has a normal mood and affect. Her behavior is normal.  Vitals reviewed.   BP 122/66   Pulse 67   Ht 5\' 6"  (1.676 m)   Wt 275 lb (124.7 kg)   SpO2 100%   BMI 44.39 kg/m  Wt Readings from Last 3 Encounters:  06/17/18 275 lb (124.7 kg)  05/09/18 270 lb (122.5 kg)  04/11/18 279 lb (126.6 kg)    Health Maintenance Due  Topic Date Due  . HIV Screening  10/04/1996    There are no preventive care reminders to display for this patient.   Lab Results  Component Value Date   TSH 2.80 08/30/2017   Lab Results  Component Value Date   WBC 8.6 08/30/2017   HGB 12.7 08/30/2017   HCT 37.4 08/30/2017   MCV 86.6 08/30/2017   PLT 327 08/30/2017   Lab Results  Component Value Date   NA 139 05/09/2018   K 3.2 (L) 05/09/2018   CO2 28 05/09/2018   GLUCOSE 88 05/09/2018   BUN 14 05/09/2018   CREATININE 0.68 05/09/2018   BILITOT 0.3 08/30/2017   ALKPHOS 69 07/25/2012   AST 15 08/30/2017   ALT 12 08/30/2017   PROT 7.0 08/30/2017   ALBUMIN 3.9 07/25/2012   CALCIUM 8.7 05/09/2018   No results found for: CHOL No results found for: HDL No results found for: LDLCALC No results found for: TRIG No results found for: CHOLHDL No results found for: EMVV6P     Assessment & Plan:   Problem List Items Addressed This Visit    None    Visit Diagnoses    Hives    -  Primary   Allergic contact dermatitis due to other agents         Hives-gave reassurance that seems to be improving with the oral Benadryl.  Recommend that she switch to a 24-hour acting antihistamine and take twice a day.  Did warn that it could dry her mouth out.  For the contact dermatitis recommend topical steroid cream and trying to moisturize frequently throughout the day.  Meds ordered this encounter  Medications  . triamcinolone cream (KENALOG) 0.1 %    Sig: Apply 1 application topically 2 (two) times  daily.    Dispense:  30 g    Refill:  0     Santina Evans  Madilyn Fireman, MD

## 2018-06-17 NOTE — Patient Instructions (Signed)
Try Allegra or Zyrtec or Claritin. Take twice a day.

## 2018-10-22 DIAGNOSIS — Z7189 Other specified counseling: Secondary | ICD-10-CM | POA: Diagnosis not present

## 2018-10-22 DIAGNOSIS — Z20828 Contact with and (suspected) exposure to other viral communicable diseases: Secondary | ICD-10-CM | POA: Diagnosis not present

## 2018-11-05 DIAGNOSIS — Z7189 Other specified counseling: Secondary | ICD-10-CM | POA: Diagnosis not present

## 2018-11-05 DIAGNOSIS — Z20828 Contact with and (suspected) exposure to other viral communicable diseases: Secondary | ICD-10-CM | POA: Diagnosis not present

## 2018-11-07 ENCOUNTER — Ambulatory Visit: Payer: BLUE CROSS/BLUE SHIELD | Admitting: Family Medicine

## 2018-11-07 ENCOUNTER — Encounter: Payer: Self-pay | Admitting: Family Medicine

## 2018-11-07 NOTE — Telephone Encounter (Signed)
Please call pt: needs a 6 mo f/u anyway so we can address at that time.

## 2018-11-08 ENCOUNTER — Telehealth: Payer: Self-pay | Admitting: Family Medicine

## 2018-11-08 NOTE — Telephone Encounter (Signed)
Appointment has been made. No further questions at this time.  

## 2018-11-18 ENCOUNTER — Ambulatory Visit (INDEPENDENT_AMBULATORY_CARE_PROVIDER_SITE_OTHER): Payer: BC Managed Care – PPO | Admitting: Family Medicine

## 2018-11-18 ENCOUNTER — Encounter: Payer: Self-pay | Admitting: Family Medicine

## 2018-11-18 ENCOUNTER — Other Ambulatory Visit: Payer: Self-pay

## 2018-11-18 VITALS — BP 130/55 | HR 77 | Ht 66.0 in | Wt 275.0 lb

## 2018-11-18 DIAGNOSIS — E876 Hypokalemia: Secondary | ICD-10-CM | POA: Diagnosis not present

## 2018-11-18 DIAGNOSIS — I10 Essential (primary) hypertension: Secondary | ICD-10-CM

## 2018-11-18 DIAGNOSIS — I83813 Varicose veins of bilateral lower extremities with pain: Secondary | ICD-10-CM | POA: Insufficient documentation

## 2018-11-18 MED ORDER — POTASSIUM CHLORIDE ER 10 MEQ PO CPCR: 10.0000 meq | ORAL_CAPSULE | Freq: Every day | ORAL | 0 refills | Status: DC

## 2018-11-18 NOTE — Assessment & Plan Note (Signed)
Due to recheck potassium and then we can decide if it okay to discontinue the medication or if it needs to be continued.  She actually only takes it once a day.

## 2018-11-18 NOTE — Assessment & Plan Note (Addendum)
Well controlled. Continue current regimen. Follow up in  6 months.  Due for screening lipids.

## 2018-11-18 NOTE — Progress Notes (Addendum)
Established Patient Office Visit  Subjective:  Patient ID: Judy Marshall, female    DOB: 1980-08-07  Age: 38 y.o. MRN: [id number]  CC:  Chief Complaint  Patient presents with  . Hypertension    HPI Judy Marshall presents for  Hypertension- Pt denies chest pain, SOB, dizziness, or heart palpitations.  Taking meds as directed w/o problems.  Denies medication side effects.   She also has a history of hypokalemia and wants to know if she can discontinue her potassium.  He also wants to discuss some varicose veins on her leg.  She has one on her left upper outer thigh that is been there for years.  But more recently it is gotten larger to the point where she is even afraid to shave around it and it is also gotten more sore and tender.  She has one on her right outer thigh and a very similar location.  And she says that ones actually really tender to palpation.  She does not member any injury or trauma.  She says she has a family history of varicose veins from her mom.   Past Medical History:  Diagnosis Date  . Broken bones 1989   Left elbow  . IBS (irritable bowel syndrome)   . Pericarditis 2009    Past Surgical History:  Procedure Laterality Date  . Left arm fracture  1989  . TONSILLECTOMY  2004    Family History  Problem Relation Age of Onset  . Hypertension Mother   . Stomach cancer Maternal Grandmother   . Non-Hodgkin's lymphoma Maternal Grandfather   . Stroke Paternal Grandfather     Social History   Socioeconomic History  . Marital status: Married    Spouse name: Holley DexterMichael Fay  . Number of children: 0  . Years of education: Not on file  . Highest education level: Not on file  Occupational History  . Occupation: Database administratorECEPTIONIST    Employer: [employer]    Comment: [employer]    [place] [employer] [employer]  Social Needs  . Financial resource strain: Not on file  . Food insecurity    Worry: Not on file    Inability: Not on file  .  Transportation needs    Medical: Not on file    Non-medical: Not on file  Tobacco Use  . Smoking status: Never Smoker  . Smokeless tobacco: Never Used  Substance and Sexual Activity  . Alcohol use: No  . Drug use: No  . Sexual activity: Yes    Partners: Male    Birth control/protection: Pill  Lifestyle  . Physical activity    Days per week: Not on file    Minutes per session: Not on file  . Stress: Not on file  Relationships  . Social Musicianconnections    Talks on phone: Not on file    Gets together: Not on file    Attends religious service: Not on file    Active member of club or organization: Not on file    Attends meetings of clubs or organizations: Not on file    Relationship status: Not on file  . Intimate partner violence    Fear of current or ex partner: Not on file    Emotionally abused: Not on file    Physically abused: Not on file    Forced sexual activity: Not on file  Other Topics Concern  . Not on file  Social History Narrative   Not on an exercise program.  Outpatient Medications Prior to Visit  Medication Sig Dispense Refill  . chlorthalidone (HYGROTON) 25 MG tablet Take 0.5 tablets (12.5 mg total) by mouth daily. 90 tablet 0  . LESSINA-28 0.1-20 MG-MCG tablet TAKE 1 TABLET BY MOUTH DAILY 28 tablet 11  . potassium chloride (MICRO-K) 10 MEQ CR capsule Take 10 mEq by mouth 2 (two) times daily.    Marland Kitchen triamcinolone cream (KENALOG) 0.1 % Apply 1 application topically 2 (two) times daily. 30 g 0   No facility-administered medications prior to visit.     No Known Allergies  ROS Review of Systems    Objective:    Physical Exam  Constitutional: She is oriented to person, place, and time. She appears well-developed and well-nourished.  HENT:  Head: Normocephalic and atraumatic.  Cardiovascular: Normal rate, regular rhythm and normal heart sounds.  Pulmonary/Chest: Effort normal and breath sounds normal.  Neurological: She is alert and oriented to person,  place, and time.  Skin: Skin is warm and dry.  She does have a very protuberant varicose vein on the left upper outer thigh.  She also has several on the left anterior thigh just above the knee.  She also has most more of a spider vein looking area on her right outer thigh that is quite tender to touch.  No surrounding erythema.  Psychiatric: She has a normal mood and affect. Her behavior is normal.    BP (!) 130/55   Pulse 77   Ht 5\' 6"  (1.676 m)   Wt 275 lb (124.7 kg)   SpO2 100%   BMI 44.39 kg/m  Wt Readings from Last 3 Encounters:  11/18/18 275 lb (124.7 kg)  06/17/18 275 lb (124.7 kg)  05/09/18 270 lb (122.5 kg)     There are no preventive care reminders to display for this patient.  There are no preventive care reminders to display for this patient.  Lab Results  Component Value Date   TSH 2.80 08/30/2017   Lab Results  Component Value Date   WBC 8.6 08/30/2017   HGB 12.7 08/30/2017   HCT 37.4 08/30/2017   MCV 86.6 08/30/2017   PLT 327 08/30/2017   Lab Results  Component Value Date   NA 139 05/09/2018   K 3.2 (L) 05/09/2018   CO2 28 05/09/2018   GLUCOSE 88 05/09/2018   BUN 14 05/09/2018   CREATININE 0.68 05/09/2018   BILITOT 0.3 08/30/2017   ALKPHOS 69 07/25/2012   AST 15 08/30/2017   ALT 12 08/30/2017   PROT 7.0 08/30/2017   ALBUMIN 3.9 07/25/2012   CALCIUM 8.7 05/09/2018   No results found for: CHOL No results found for: HDL No results found for: LDLCALC No results found for: TRIG No results found for: CHOLHDL No results found for: HGBA1C    Assessment & Plan:   Problem List Items Addressed This Visit      Cardiovascular and Mediastinum   Varicose veins of both lower extremities with pain    Discussed options.  Discussed compression stocking and referral to Vein  Clinic.        Relevant Orders   Ambulatory referral to Vascular Surgery   Hypertension goal BP (blood pressure) < 130/80 - Primary    Well controlled. Continue current regimen.  Follow up in  6 months.  Due for screening lipids.       Relevant Orders   COMPLETE METABOLIC PANEL WITH GFR   Lipid panel     Other   Hypokalemia  Due to recheck potassium and then we can decide if it okay to discontinue the medication or if it needs to be continued.  She actually only takes it once a day.      Relevant Orders   COMPLETE METABOLIC PANEL WITH GFR   Lipid panel      Meds ordered this encounter  Medications  . potassium chloride (MICRO-K) 10 MEQ CR capsule    Sig: Take 1 capsule (10 mEq total) by mouth daily.    Dispense:  90 capsule    Refill:  0    Follow-up: Return in about 6 months (around 05/21/2019) for Hypertension.    Nani Gasseratherine Jaqualyn Juday, MD

## 2018-11-18 NOTE — Assessment & Plan Note (Signed)
Discussed options.  Discussed compression stocking and referral to Vein  Clinic.

## 2018-11-19 DIAGNOSIS — Z20828 Contact with and (suspected) exposure to other viral communicable diseases: Secondary | ICD-10-CM | POA: Diagnosis not present

## 2018-11-19 DIAGNOSIS — Z7189 Other specified counseling: Secondary | ICD-10-CM | POA: Diagnosis not present

## 2018-11-20 ENCOUNTER — Other Ambulatory Visit: Payer: Self-pay | Admitting: Family Medicine

## 2018-11-20 DIAGNOSIS — I10 Essential (primary) hypertension: Secondary | ICD-10-CM

## 2018-11-26 DIAGNOSIS — Z7189 Other specified counseling: Secondary | ICD-10-CM | POA: Diagnosis not present

## 2018-11-26 DIAGNOSIS — Z20828 Contact with and (suspected) exposure to other viral communicable diseases: Secondary | ICD-10-CM | POA: Diagnosis not present

## 2018-12-09 DIAGNOSIS — I83813 Varicose veins of bilateral lower extremities with pain: Secondary | ICD-10-CM | POA: Diagnosis not present

## 2018-12-23 ENCOUNTER — Other Ambulatory Visit: Payer: Self-pay

## 2018-12-23 ENCOUNTER — Ambulatory Visit (INDEPENDENT_AMBULATORY_CARE_PROVIDER_SITE_OTHER): Payer: BC Managed Care – PPO | Admitting: *Deleted

## 2018-12-23 DIAGNOSIS — I10 Essential (primary) hypertension: Secondary | ICD-10-CM | POA: Diagnosis not present

## 2018-12-23 DIAGNOSIS — R309 Painful micturition, unspecified: Secondary | ICD-10-CM

## 2018-12-23 DIAGNOSIS — N898 Other specified noninflammatory disorders of vagina: Secondary | ICD-10-CM

## 2018-12-23 DIAGNOSIS — E876 Hypokalemia: Secondary | ICD-10-CM | POA: Diagnosis not present

## 2018-12-23 DIAGNOSIS — R300 Dysuria: Secondary | ICD-10-CM | POA: Diagnosis not present

## 2018-12-23 NOTE — Progress Notes (Signed)
Pt here with c/o's painful urination for 3 days and h/o BV  She is requesting a self swab and urine culture.  Will notify as results are available.

## 2018-12-24 LAB — COMPLETE METABOLIC PANEL WITH GFR
AG Ratio: 1.1 (calc) (ref 1.0–2.5)
ALT: 13 U/L (ref 6–29)
AST: 18 U/L (ref 10–30)
Albumin: 3.6 g/dL (ref 3.6–5.1)
Alkaline phosphatase (APISO): 70 U/L (ref 31–125)
BUN: 17 mg/dL (ref 7–25)
CO2: 27 mmol/L (ref 20–32)
Calcium: 8.7 mg/dL (ref 8.6–10.2)
Chloride: 101 mmol/L (ref 98–110)
Creat: 0.81 mg/dL (ref 0.50–1.10)
GFR, Est African American: 108 mL/min/{1.73_m2} (ref >=60)
GFR, Est Non African American: 93 mL/min/{1.73_m2} (ref >=60)
Globulin: 3.2 g/dL (calc) (ref 1.9–3.7)
Glucose, Bld: 106 mg/dL — ABNORMAL HIGH (ref 65–99)
Potassium: 3.1 mmol/L — ABNORMAL LOW (ref 3.5–5.3)
Sodium: 138 mmol/L (ref 135–146)
Total Bilirubin: 0.4 mg/dL (ref 0.2–1.2)
Total Protein: 6.8 g/dL (ref 6.1–8.1)

## 2018-12-24 LAB — LIPID PANEL
Cholesterol: 139 mg/dL (ref <200)
HDL: 41 mg/dL — ABNORMAL LOW (ref >=50)
LDL Cholesterol (Calc): 78 mg/dL (calc)
Non-HDL Cholesterol (Calc): 98 mg/dL (calc) (ref <130)
Total CHOL/HDL Ratio: 3.4 (calc) (ref <5.0)
Triglycerides: 117 mg/dL (ref <150)

## 2018-12-24 LAB — CERVICOVAGINAL ANCILLARY ONLY
Bacterial Vaginitis (gardnerella): NEGATIVE
Candida Glabrata: NEGATIVE
Candida Vaginitis: NEGATIVE
Molecular Disclaimer: NEGATIVE
Molecular Disclaimer: NEGATIVE
Molecular Disclaimer: NORMAL

## 2018-12-24 LAB — CULTURE, URINE COMPREHENSIVE
MICRO NUMBER:: 929556
SPECIMEN QUALITY:: ADEQUATE

## 2018-12-27 ENCOUNTER — Ambulatory Visit (INDEPENDENT_AMBULATORY_CARE_PROVIDER_SITE_OTHER): Payer: BC Managed Care – PPO | Admitting: Obstetrics and Gynecology

## 2018-12-27 ENCOUNTER — Encounter: Payer: Self-pay | Admitting: Obstetrics and Gynecology

## 2018-12-27 ENCOUNTER — Other Ambulatory Visit: Payer: Self-pay

## 2018-12-27 VITALS — BP 135/69 | HR 87 | Resp 16 | Ht 66.0 in | Wt 273.0 lb

## 2018-12-27 DIAGNOSIS — B373 Candidiasis of vulva and vagina: Secondary | ICD-10-CM | POA: Diagnosis not present

## 2018-12-27 DIAGNOSIS — Z01419 Encounter for gynecological examination (general) (routine) without abnormal findings: Secondary | ICD-10-CM

## 2018-12-27 DIAGNOSIS — N898 Other specified noninflammatory disorders of vagina: Secondary | ICD-10-CM

## 2018-12-27 DIAGNOSIS — N926 Irregular menstruation, unspecified: Secondary | ICD-10-CM | POA: Diagnosis not present

## 2018-12-27 DIAGNOSIS — R7303 Prediabetes: Secondary | ICD-10-CM

## 2018-12-27 DIAGNOSIS — R300 Dysuria: Secondary | ICD-10-CM | POA: Diagnosis not present

## 2018-12-27 NOTE — Progress Notes (Signed)
GYNECOLOGY ENCOUNTER NOTE  History:     Judy Marshall is a 38 y.o. G0P0000 female here for a routine annual gynecologic exam.  Current complaints: vaginal irritation, changes in her menstrual cycle.   Denies abnormal vaginal bleeding, discharge, pelvic pain, problems with intercourse or other gynecologic concerns.     In the last few months has had very little bleeding with menstrual cycles. Has always had like clock work periods. She has been on the same OCP since she was 38 y/o. Periods have become very light/ brown spotting. Hx of frequent BV. Feels irritated in the vaginal area. No odor.   Gynecologic History Patient's last menstrual period was 12/12/2018. Contraception: OCP (estrogen/progesterone) Last Pap: 2019. Results were: normal with negative HPV  Obstetric History OB History  Gravida Para Term Preterm AB Living  0 0 0 0 0 0  SAB TAB Ectopic Multiple Live Births  0 0 0 0      Past Medical History:  Diagnosis Date  . Broken bones 1989   Left elbow  . IBS (irritable bowel syndrome)   . Pericarditis 2009    Past Surgical History:  Procedure Laterality Date  . Left arm fracture  1989  . TONSILLECTOMY  2004    Current Outpatient Medications on File Prior to Visit  Medication Sig Dispense Refill  . chlorthalidone (HYGROTON) 25 MG tablet TAKE 1/2 TABLET BY MOUTH DAILY 90 tablet 0  . LESSINA-28 0.1-20 MG-MCG tablet TAKE 1 TABLET BY MOUTH DAILY 28 tablet 11  . omeprazole (PRILOSEC) 20 MG capsule Take by mouth.    . potassium chloride (MICRO-K) 10 MEQ CR capsule Take 1 capsule (10 mEq total) by mouth daily. 90 capsule 0  . Probiotic Product (PROBIOTIC-10 PO) Take by mouth.     No current facility-administered medications on file prior to visit.     No Known Allergies  Social History:  reports that she has never smoked. She has never used smokeless tobacco. She reports that she does not drink alcohol or use drugs.  Family History  Problem Relation Age of  Onset  . Hypertension Mother   . Stomach cancer Maternal Grandmother   . Non-Hodgkin's lymphoma Maternal Grandfather   . Stroke Paternal Grandfather     The following portions of the patient's history were reviewed and updated as appropriate: allergies, current medications, past family history, past medical history, past social history, past surgical history and problem list.  Review of Systems Pertinent items noted in HPI and remainder of comprehensive ROS otherwise negative.  Physical Exam:  BP 135/69   Pulse 87   Resp 16   Ht 5\' 6"  (1.676 m)   Wt 273 lb (123.8 kg)   LMP 12/12/2018   BMI 44.06 kg/m  CONSTITUTIONAL: Well-developed, well-nourished female in no acute distress.  HENT:  Normocephalic SKIN: Skin is warm and dry. No rash noted. Not diaphoretic. No erythema. No pallor. MUSCULOSKELETAL: Normal range of motion. No tenderness.  No cyanosis, clubbing, or edema.  2+ distal pulses. NEUROLOGIC: Alert and oriented to person, place, and time. Normal reflexes, muscle tone coordination. No cranial nerve deficit noted. PSYCHIATRIC: Normal mood and affect. Normal behavior. Normal judgment and thought content. CARDIOVASCULAR: Normal heart rate noted, regular rhythm RESPIRATORY: Clear to auscultation bilaterally. Effort and breath sounds normal, no problems with respiration noted. ABDOMEN: Soft, normal bowel sounds, no distention noted.  No tenderness, rebound or guarding.  PELVIC: Normal appearing external genitalia; normal appearing vaginal mucosa and cervix.  Brown vaginal discharge  noted.  Pap smear obtained.  Normal uterine size, no other palpable masses, no uterine or adnexal tenderness.  Assessment and Plan:   1. Vaginal discharge  - Cervicovaginal ancillary only( [place]) - HgB A1c - UA/ Urine culture   2. Menstrual cycle problem  - Cervicovaginal ancillary only( [place]) - HgB A1c  3. Borderline diabetes  A1c today: fasting BS elevated to 106 May be  contributing to menstrual cycle abnormalities.     Taiana Temkin, Harolyn Rutherford, NP [facility] [employer], [facility]

## 2018-12-28 LAB — URINE CULTURE
MICRO NUMBER:: 948700
Result:: NO GROWTH
SPECIMEN QUALITY:: ADEQUATE

## 2018-12-28 LAB — HEMOGLOBIN A1C
Hgb A1c MFr Bld: 5.7 % of total Hgb — ABNORMAL HIGH (ref <5.7)
Mean Plasma Glucose: 117 (calc)
eAG (mmol/L): 6.5 (calc)

## 2018-12-30 LAB — CERVICOVAGINAL ANCILLARY ONLY
Bacterial Vaginitis (gardnerella): NEGATIVE
Candida Glabrata: NEGATIVE
Candida Vaginitis: NEGATIVE

## 2018-12-31 DIAGNOSIS — Z20828 Contact with and (suspected) exposure to other viral communicable diseases: Secondary | ICD-10-CM | POA: Diagnosis not present

## 2018-12-31 DIAGNOSIS — Z7189 Other specified counseling: Secondary | ICD-10-CM | POA: Diagnosis not present

## 2019-01-01 ENCOUNTER — Telehealth: Payer: Self-pay

## 2019-01-01 NOTE — Telephone Encounter (Signed)
Spoke with pt and she is aware of negative Aptima and urine culture.

## 2019-01-02 ENCOUNTER — Other Ambulatory Visit: Payer: Self-pay | Admitting: Obstetrics and Gynecology

## 2019-01-02 DIAGNOSIS — N926 Irregular menstruation, unspecified: Secondary | ICD-10-CM

## 2019-01-02 NOTE — Progress Notes (Signed)
A1C elevated at 5.7. Testosterone level added. Patient aware and agree's with plan of care.    Noni Saupe I, NP 01/02/2019 3:25 PM

## 2019-01-06 DIAGNOSIS — E876 Hypokalemia: Secondary | ICD-10-CM | POA: Diagnosis not present

## 2019-01-07 ENCOUNTER — Other Ambulatory Visit: Payer: Self-pay | Admitting: *Deleted

## 2019-01-07 DIAGNOSIS — E876 Hypokalemia: Secondary | ICD-10-CM

## 2019-01-07 LAB — POTASSIUM: Potassium: 3.3 mmol/L — ABNORMAL LOW (ref 3.5–5.3)

## 2019-01-14 DIAGNOSIS — Z03818 Encounter for observation for suspected exposure to other biological agents ruled out: Secondary | ICD-10-CM | POA: Diagnosis not present

## 2019-01-21 DIAGNOSIS — Z03818 Encounter for observation for suspected exposure to other biological agents ruled out: Secondary | ICD-10-CM | POA: Diagnosis not present

## 2019-01-28 DIAGNOSIS — Z03818 Encounter for observation for suspected exposure to other biological agents ruled out: Secondary | ICD-10-CM | POA: Diagnosis not present

## 2019-02-04 DIAGNOSIS — Z20828 Contact with and (suspected) exposure to other viral communicable diseases: Secondary | ICD-10-CM | POA: Diagnosis not present

## 2019-02-10 ENCOUNTER — Other Ambulatory Visit: Payer: Self-pay

## 2019-02-10 ENCOUNTER — Other Ambulatory Visit (INDEPENDENT_AMBULATORY_CARE_PROVIDER_SITE_OTHER): Payer: BC Managed Care – PPO | Admitting: *Deleted

## 2019-02-10 DIAGNOSIS — R6882 Decreased libido: Secondary | ICD-10-CM

## 2019-02-11 ENCOUNTER — Encounter: Payer: Self-pay | Admitting: *Deleted

## 2019-02-11 DIAGNOSIS — Z03818 Encounter for observation for suspected exposure to other biological agents ruled out: Secondary | ICD-10-CM | POA: Diagnosis not present

## 2019-02-13 DIAGNOSIS — Z03818 Encounter for observation for suspected exposure to other biological agents ruled out: Secondary | ICD-10-CM | POA: Diagnosis not present

## 2019-02-14 LAB — TESTOSTERONE, FREE

## 2019-02-14 LAB — EXTRA SPECIMEN

## 2019-02-14 LAB — TESTOSTERONE, TOTAL, LC/MS/MS

## 2019-02-17 ENCOUNTER — Other Ambulatory Visit: Payer: BC Managed Care – PPO

## 2019-02-17 ENCOUNTER — Other Ambulatory Visit: Payer: Self-pay

## 2019-02-24 ENCOUNTER — Other Ambulatory Visit: Payer: Self-pay | Admitting: Obstetrics & Gynecology

## 2019-02-24 DIAGNOSIS — Z3009 Encounter for other general counseling and advice on contraception: Secondary | ICD-10-CM

## 2019-02-25 DIAGNOSIS — Z03818 Encounter for observation for suspected exposure to other biological agents ruled out: Secondary | ICD-10-CM | POA: Diagnosis not present

## 2019-02-27 DIAGNOSIS — Z03818 Encounter for observation for suspected exposure to other biological agents ruled out: Secondary | ICD-10-CM | POA: Diagnosis not present

## 2019-03-04 ENCOUNTER — Other Ambulatory Visit: Payer: Self-pay | Admitting: *Deleted

## 2019-03-04 DIAGNOSIS — Z3009 Encounter for other general counseling and advice on contraception: Secondary | ICD-10-CM

## 2019-03-04 DIAGNOSIS — Z03818 Encounter for observation for suspected exposure to other biological agents ruled out: Secondary | ICD-10-CM | POA: Diagnosis not present

## 2019-03-04 MED ORDER — LEVONORGESTREL-ETHINYL ESTRAD 0.1-20 MG-MCG PO TABS: 1.0000 | ORAL_TABLET | Freq: Every day | ORAL | 2 refills | Status: DC

## 2019-03-04 NOTE — Telephone Encounter (Signed)
Pt called requesting a RF on her OCP's.  2 RF given as pt is due for annual after 03/26/19

## 2019-03-07 DIAGNOSIS — Z03818 Encounter for observation for suspected exposure to other biological agents ruled out: Secondary | ICD-10-CM | POA: Diagnosis not present

## 2019-03-11 DIAGNOSIS — Z03818 Encounter for observation for suspected exposure to other biological agents ruled out: Secondary | ICD-10-CM | POA: Diagnosis not present

## 2019-03-14 DIAGNOSIS — Z03818 Encounter for observation for suspected exposure to other biological agents ruled out: Secondary | ICD-10-CM | POA: Diagnosis not present

## 2019-03-17 DIAGNOSIS — Z03818 Encounter for observation for suspected exposure to other biological agents ruled out: Secondary | ICD-10-CM | POA: Diagnosis not present

## 2019-03-19 DIAGNOSIS — Z03818 Encounter for observation for suspected exposure to other biological agents ruled out: Secondary | ICD-10-CM | POA: Diagnosis not present

## 2019-03-24 ENCOUNTER — Other Ambulatory Visit: Payer: Self-pay

## 2019-03-24 ENCOUNTER — Ambulatory Visit (INDEPENDENT_AMBULATORY_CARE_PROVIDER_SITE_OTHER): Payer: BC Managed Care – PPO | Admitting: Obstetrics & Gynecology

## 2019-03-24 ENCOUNTER — Encounter: Payer: Self-pay | Admitting: Obstetrics & Gynecology

## 2019-03-24 VITALS — BP 131/69 | HR 67 | Ht 66.0 in | Wt 275.0 lb

## 2019-03-24 DIAGNOSIS — Z01419 Encounter for gynecological examination (general) (routine) without abnormal findings: Secondary | ICD-10-CM | POA: Diagnosis not present

## 2019-03-24 DIAGNOSIS — Z03818 Encounter for observation for suspected exposure to other biological agents ruled out: Secondary | ICD-10-CM | POA: Diagnosis not present

## 2019-03-24 NOTE — Progress Notes (Signed)
Subjective:    Judy Marshall is a 38 y.o. married P0 who presents for an annual exam. The patient has no complaints today. She rarely has periods, on OCPs. The patient is sexually active. GYN screening history: last pap: was normal. The patient wears seatbelts: yes. The patient participates in regular exercise: yes. Has the patient ever been transfused or tattooed?: no. The patient reports that there is not domestic violence in her life.   Menstrual History: OB History    Gravida  0   Para  0   Term  0   Preterm  0   AB  0   Living  0     SAB  0   TAB  0   Ectopic  0   Multiple  0   Live Births              Menarche age: 13Patient's last menstrual period was 03/06/2019.    The following portions of the patient's history were reviewed and updated as appropriate: allergies, current medications, past family history, past medical history, past social history, past surgical history and problem list.  Review of Systems Pertinent items are noted in HPI.   FH- no breast/gyn/colon cancer Married for 2 1/2 years, denies dyspareunia Works at [employer]   Objective:    BP 131/69   Pulse 67   Ht 5\' 6"  (1.676 m)   Wt 275 lb (124.7 kg)   LMP 03/06/2019   BMI 44.39 kg/m   General Appearance:    Alert, cooperative, no distress, appears stated age  Head:    Normocephalic, without obvious abnormality, atraumatic  Eyes:    PERRL, conjunctiva/corneas clear, EOM's intact, fundi    benign, both eyes  Ears:    Normal TM's and external ear canals, both ears  Nose:   Nares normal, septum midline, mucosa normal, no drainage    or sinus tenderness  Throat:   Lips, mucosa, and tongue normal; teeth and gums normal  Neck:   Supple, symmetrical, trachea midline, no adenopathy;    thyroid:  no enlargement/tenderness/nodules; no carotid   bruit or JVD  Back:     Symmetric, no curvature, ROM normal, no CVA tenderness  Lungs:     Clear to auscultation bilaterally, respirations  unlabored  Chest Wall:    No tenderness or deformity   Heart:    Regular rate and rhythm, S1 and S2 normal, no murmur, rub   or gallop  Breast Exam:    No tenderness, masses, or nipple abnormality  Abdomen:     Soft, non-tender, bowel sounds active all four quadrants,    no masses, no organomegaly  Genitalia:    Normal female without lesion, discharge or tenderness, no palpable masses with bimanual exam     Extremities:   Extremities normal, atraumatic, no cyanosis or edema  Pulses:   2+ and symmetric all extremities  Skin:   Skin color, texture, turgor normal, no rashes or lesions  Lymph nodes:   Cervical, supraclavicular, and axillary nodes normal  Neurologic:   CNII-XII intact, normal strength, sensation and reflexes    throughout  .    Assessment:    Healthy female exam.    Plan:     She declines a pap smear this year Rec healthy lifestyle

## 2019-03-24 NOTE — Progress Notes (Signed)
Last pap- 03/05/18- negative

## 2019-03-26 DIAGNOSIS — Z03818 Encounter for observation for suspected exposure to other biological agents ruled out: Secondary | ICD-10-CM | POA: Diagnosis not present

## 2019-04-01 DIAGNOSIS — Z20828 Contact with and (suspected) exposure to other viral communicable diseases: Secondary | ICD-10-CM | POA: Diagnosis not present

## 2019-04-03 DIAGNOSIS — Z20828 Contact with and (suspected) exposure to other viral communicable diseases: Secondary | ICD-10-CM | POA: Diagnosis not present

## 2019-04-07 DIAGNOSIS — I83813 Varicose veins of bilateral lower extremities with pain: Secondary | ICD-10-CM | POA: Diagnosis not present

## 2019-04-08 DIAGNOSIS — Z20828 Contact with and (suspected) exposure to other viral communicable diseases: Secondary | ICD-10-CM | POA: Diagnosis not present

## 2019-04-11 DIAGNOSIS — Z20828 Contact with and (suspected) exposure to other viral communicable diseases: Secondary | ICD-10-CM | POA: Diagnosis not present

## 2019-04-14 ENCOUNTER — Other Ambulatory Visit: Payer: Self-pay | Admitting: *Deleted

## 2019-04-14 DIAGNOSIS — Z3009 Encounter for other general counseling and advice on contraception: Secondary | ICD-10-CM

## 2019-04-14 MED ORDER — LEVONORGESTREL-ETHINYL ESTRAD 0.1-20 MG-MCG PO TABS: 1.0000 | ORAL_TABLET | Freq: Every day | ORAL | 11 refills | Status: DC

## 2019-04-15 DIAGNOSIS — Z20828 Contact with and (suspected) exposure to other viral communicable diseases: Secondary | ICD-10-CM | POA: Diagnosis not present

## 2019-04-18 DIAGNOSIS — Z20828 Contact with and (suspected) exposure to other viral communicable diseases: Secondary | ICD-10-CM | POA: Diagnosis not present

## 2019-04-22 DIAGNOSIS — Z20828 Contact with and (suspected) exposure to other viral communicable diseases: Secondary | ICD-10-CM | POA: Diagnosis not present

## 2019-04-28 DIAGNOSIS — Z20828 Contact with and (suspected) exposure to other viral communicable diseases: Secondary | ICD-10-CM | POA: Diagnosis not present

## 2019-04-29 DIAGNOSIS — I83813 Varicose veins of bilateral lower extremities with pain: Secondary | ICD-10-CM | POA: Diagnosis not present

## 2019-05-20 ENCOUNTER — Other Ambulatory Visit: Payer: Self-pay | Admitting: Family Medicine

## 2019-05-22 ENCOUNTER — Ambulatory Visit: Payer: BC Managed Care – PPO | Admitting: Family Medicine

## 2019-05-31 ENCOUNTER — Other Ambulatory Visit: Payer: Self-pay | Admitting: Family Medicine

## 2019-05-31 DIAGNOSIS — I10 Essential (primary) hypertension: Secondary | ICD-10-CM

## 2019-06-02 NOTE — Telephone Encounter (Signed)
Needs appointment

## 2019-06-25 ENCOUNTER — Encounter: Payer: Self-pay | Admitting: Family Medicine

## 2019-06-25 ENCOUNTER — Other Ambulatory Visit: Payer: Self-pay

## 2019-06-25 ENCOUNTER — Ambulatory Visit (INDEPENDENT_AMBULATORY_CARE_PROVIDER_SITE_OTHER): Payer: BC Managed Care – PPO | Admitting: Family Medicine

## 2019-06-25 VITALS — BP 156/67 | HR 66 | Temp 98.0°F | Ht 66.14 in | Wt 273.0 lb

## 2019-06-25 DIAGNOSIS — R7301 Impaired fasting glucose: Secondary | ICD-10-CM

## 2019-06-25 DIAGNOSIS — N898 Other specified noninflammatory disorders of vagina: Secondary | ICD-10-CM | POA: Insufficient documentation

## 2019-06-25 LAB — POCT URINALYSIS DIP (CLINITEK)
Bilirubin, UA: NEGATIVE
Blood, UA: NEGATIVE
Glucose, UA: NEGATIVE mg/dL
Ketones, POC UA: NEGATIVE mg/dL
Leukocytes, UA: NEGATIVE
Nitrite, UA: NEGATIVE
POC PROTEIN,UA: NEGATIVE
Spec Grav, UA: 1.025 (ref 1.010–1.025)
Urobilinogen, UA: 1 E.U./dL
pH, UA: 8.5 — AB (ref 5.0–8.0)

## 2019-06-25 LAB — WET PREP FOR TRICH, YEAST, CLUE
MICRO NUMBER:: 10312437
Specimen Quality: ADEQUATE

## 2019-06-25 LAB — POCT GLYCOSYLATED HEMOGLOBIN (HGB A1C): Hemoglobin A1C: 6 % — AB (ref 4.0–5.6)

## 2019-06-25 MED ORDER — METRONIDAZOLE 0.75 % VA GEL: 1.0000 | Freq: Every day | VAGINAL | 0 refills | Status: AC

## 2019-06-25 NOTE — Assessment & Plan Note (Signed)
Recurrent vaginal symptoms.  It often will eventually go away on its own even without treatment but it occurs often times around her menstrual cycle.  She had a bad BV infection about 2 years ago finally got it cleared up and then did well until about 6 months ago.  Since then she has had on and off very intense itching to the point that it wakes her up at night.  She denies any odor change.  She also did not have a period for the most a year up until January and then it has restarted since then.  She did see her GYN and they suggested that it could be either PCOS, impaired fasting glucose which they discovered recently or even prevent premature menopause.  Her mother did go into menopause in her late 102s.  We discussed a trial of boric acid suppositories these can be found over-the-counter information provided.  Also did a wet prep today will call with those results.  Urinalysis was negative.

## 2019-06-25 NOTE — Assessment & Plan Note (Signed)
Diagnosis.  Hemoglobin A1c of 6.0 today.  She is already made some great dietary changes she is been doing keto and is already lost about 5 to 7 pounds she is really increased her activity level as well encouraged her to just continue to work on this.  I like to see her back in 4 months to see if she is able get her A1c back down she wants to hold off on any medication such as Metformin for now.

## 2019-06-25 NOTE — Progress Notes (Signed)
Patient states she has a hx of BV. Last year she did not have any menstrual cycles but would have the symptoms of BV or yeast infection.  In January her menstrual cycle's restarted and are regular.  She's did several self swabs and they were all negative. Had a swab completely by GYN; it was negative also. The GYN stated that she did see some irritation and dried blood on the cervix.  Currently she experiences intermittent irritation and itching episodes throughout the day.  Pt would like some kind of cream, if possible to help with the sx.

## 2019-06-25 NOTE — Progress Notes (Signed)
Acute Office Visit  Subjective:    Patient ID: Judy Marshall, female    DOB: 24-Sep-1980, 39 y.o.   MRN: [id number]  No chief complaint on file.   HPI Patient is in today for Patient states she has a hx of BV. Last year she did not have any menstrual cycles but would have the symptoms of BV or yeast infection.   In January her menstrual cycle's restarted and are regular. She's did several self swabs and they were all negative. Had a swab completely by GYN; it was negative also. The GYN stated that she did see some irritation and dried blood on the cervix. Currently she experiences intermittent irritation and itching episodes throughout the day.  Pt would like some kind of cream, if possible to help with the sx. Has tried probiotic and cranberry pills.    Past Medical History:  Diagnosis Date  . Broken bones 1989   Left elbow  . IBS (irritable bowel syndrome)   . Pericarditis 2009    Past Surgical History:  Procedure Laterality Date  . Left arm fracture  1989  . TONSILLECTOMY  2004    Family History  Problem Relation Age of Onset  . Hypertension Mother   . Stomach cancer Maternal Grandmother   . Non-Hodgkin's lymphoma Maternal Grandfather   . Stroke Paternal Grandfather     Social History   Socioeconomic History  . Marital status: Married    Spouse name: Holley Dexter  . Number of children: 0  . Years of education: Not on file  . Highest education level: Not on file  Occupational History  . Occupation: Database administrator: [employer]    Comment: [place] [employer] [place]    Employer: [employer]  Tobacco Use  . Smoking status: Never Smoker  . Smokeless tobacco: Never Used  Substance and Sexual Activity  . Alcohol use: No  . Drug use: No  . Sexual activity: Yes    Partners: Male    Birth control/protection: Pill  Other Topics Concern  . Not on file  Social History Narrative   Not on an exercise program.   Social Determinants of Health    Financial Resource Strain:   . Difficulty of Paying Living Expenses:   Food Insecurity:   . Worried About Programme researcher, broadcasting/film/video in the Last Year:   . Barista in the Last Year:   Transportation Needs:   . Freight forwarder (Medical):   Marland Kitchen Lack of Transportation (Non-Medical):   Physical Activity:   . Days of Exercise per Week:   . Minutes of Exercise per Session:   Stress:   . Feeling of Stress :   Social Connections:   . Frequency of Communication with Friends and Family:   . Frequency of Social Gatherings with Friends and Family:   . Attends Religious Services:   . Active Member of Clubs or Organizations:   . Attends Banker Meetings:   Marland Kitchen Marital Status:   Intimate Partner Violence:   . Fear of Current or Ex-Partner:   . Emotionally Abused:   Marland Kitchen Physically Abused:   . Sexually Abused:     Outpatient Medications Prior to Visit  Medication Sig Dispense Refill  . chlorthalidone (HYGROTON) 25 MG tablet Take 0.5 tablets (12.5 mg total) by mouth daily. PLEASE MAKE APPOINTMENT 90 tablet 0  . diazepam (VALIUM) 5 MG tablet TAKE 1 TABLET BY MOUTH ONE HOUR PRIOR TO PROCEDURE AND  1 TABLET ON ARRIVAL TO PROCEDURE    . levonorgestrel-ethinyl estradiol (LESSINA-28) 0.1-20 MG-MCG tablet Take 1 tablet by mouth daily. 28 tablet 11  . omeprazole (PRILOSEC) 20 MG capsule Take by mouth.    . potassium chloride (KLOR-CON) 10 MEQ tablet TAKE 1 TABLET BY MOUTH DAILY 90 tablet 3  . Probiotic Product (PROBIOTIC-10 PO) Take by mouth.     No facility-administered medications prior to visit.    No Known Allergies  Review of Systems     Objective:    Physical Exam Vitals reviewed.  Constitutional:      Appearance: She is well-developed.  HENT:     Head: Normocephalic and atraumatic.  Eyes:     Conjunctiva/sclera: Conjunctivae normal.  Cardiovascular:     Rate and Rhythm: Normal rate.  Pulmonary:     Effort: Pulmonary effort is normal.  Skin:    General: Skin is  dry.     Coloration: Skin is not pale.  Neurological:     Mental Status: She is alert and oriented to person, place, and time.  Psychiatric:        Behavior: Behavior normal.     BP (!) 156/67   Pulse 66   Temp 98 F (36.7 C) (Oral)   Ht 5' 6.14" (1.68 m)   Wt 273 lb (123.8 kg)   SpO2 100%   BMI 43.87 kg/m  Wt Readings from Last 3 Encounters:  06/25/19 273 lb (123.8 kg)  03/24/19 275 lb (124.7 kg)  12/27/18 273 lb (123.8 kg)    There are no preventive care reminders to display for this patient.  There are no preventive care reminders to display for this patient.   Lab Results  Component Value Date   TSH 2.80 08/30/2017   Lab Results  Component Value Date   WBC 8.6 08/30/2017   HGB 12.7 08/30/2017   HCT 37.4 08/30/2017   MCV 86.6 08/30/2017   PLT 327 08/30/2017   Lab Results  Component Value Date   NA 138 12/23/2018   K 3.3 (L) 01/06/2019   CO2 27 12/23/2018   GLUCOSE 106 (H) 12/23/2018   BUN 17 12/23/2018   CREATININE 0.81 12/23/2018   BILITOT 0.4 12/23/2018   ALKPHOS 69 07/25/2012   AST 18 12/23/2018   ALT 13 12/23/2018   PROT 6.8 12/23/2018   ALBUMIN 3.9 07/25/2012   CALCIUM 8.7 12/23/2018   Lab Results  Component Value Date   CHOL 139 12/23/2018   Lab Results  Component Value Date   HDL 41 (L) 12/23/2018   Lab Results  Component Value Date   LDLCALC 78 12/23/2018   Lab Results  Component Value Date   TRIG 117 12/23/2018   Lab Results  Component Value Date   CHOLHDL 3.4 12/23/2018   Lab Results  Component Value Date   HGBA1C 6.0 (A) 06/25/2019       Assessment & Plan:   Problem List Items Addressed This Visit      Endocrine   IFG (impaired fasting glucose)    Diagnosis.  Hemoglobin A1c of 6.0 today.  She is already made some great dietary changes she is been doing keto and is already lost about 5 to 7 pounds she is really increased her activity level as well encouraged her to just continue to work on this.  I like to see her  back in 4 months to see if she is able get her A1c back down she wants to hold off on any medication  such as Metformin for now.      Relevant Orders   POCT HgB A1C (Completed)     Other   Vaginal irritation - Primary    Recurrent vaginal symptoms.  It often will eventually go away on its own even without treatment but it occurs often times around her menstrual cycle.  She had a bad BV infection about 2 years ago finally got it cleared up and then did well until about 6 months ago.  Since then she has had on and off very intense itching to the point that it wakes her up at night.  She denies any odor change.  She also did not have a period for the most a year up until January and then it has restarted since then.  She did see her GYN and they suggested that it could be either PCOS, impaired fasting glucose which they discovered recently or even prevent premature menopause.  Her mother did go into menopause in her late 33s.  We discussed a trial of boric acid suppositories these can be found over-the-counter information provided.  Also did a wet prep today will call with those results.  Urinalysis was negative.      Relevant Orders   WET PREP FOR [place], YEAST, CLUE (Completed)   POCT URINALYSIS DIP (CLINITEK) (Completed)       Meds ordered this encounter  Medications  . metroNIDAZOLE (METROGEL VAGINAL) 0.75 % vaginal gel    Sig: Place 1 Applicatorful vaginally at bedtime for 7 days.    Dispense:  70 g    Refill:  0     Beatrice Lecher, MD

## 2019-07-31 DIAGNOSIS — I83813 Varicose veins of bilateral lower extremities with pain: Secondary | ICD-10-CM | POA: Diagnosis not present

## 2019-09-05 DIAGNOSIS — I83813 Varicose veins of bilateral lower extremities with pain: Secondary | ICD-10-CM | POA: Diagnosis not present

## 2019-10-24 ENCOUNTER — Ambulatory Visit: Payer: BC Managed Care – PPO | Admitting: Family Medicine

## 2019-12-23 ENCOUNTER — Telehealth: Payer: Self-pay | Admitting: Family Medicine

## 2019-12-23 NOTE — Telephone Encounter (Signed)
Appointment has been made. No further questions at this time.  

## 2019-12-23 NOTE — Telephone Encounter (Signed)
Please schedule patient in an available Acute time slot. Thanks.

## 2019-12-23 NOTE — Telephone Encounter (Signed)
Patient is needing a hospital follow up for next week. Please advise if she can be worked in or be put in an acute. Was seen at [facility] ED for reaction to her covid shot. Wanted to possibly get some testing done.

## 2019-12-26 DIAGNOSIS — R0602 Shortness of breath: Secondary | ICD-10-CM | POA: Diagnosis not present

## 2019-12-26 DIAGNOSIS — I308 Other forms of acute pericarditis: Secondary | ICD-10-CM | POA: Diagnosis not present

## 2019-12-30 ENCOUNTER — Encounter: Payer: Self-pay | Admitting: Family Medicine

## 2019-12-30 ENCOUNTER — Other Ambulatory Visit: Payer: Self-pay

## 2019-12-30 ENCOUNTER — Ambulatory Visit (INDEPENDENT_AMBULATORY_CARE_PROVIDER_SITE_OTHER): Payer: BC Managed Care – PPO | Admitting: Family Medicine

## 2019-12-30 VITALS — BP 137/72 | HR 75 | Temp 99.4°F | Wt 272.0 lb

## 2019-12-30 DIAGNOSIS — M2550 Pain in unspecified joint: Secondary | ICD-10-CM

## 2019-12-30 DIAGNOSIS — I300 Acute nonspecific idiopathic pericarditis: Secondary | ICD-10-CM | POA: Diagnosis not present

## 2019-12-30 DIAGNOSIS — E876 Hypokalemia: Secondary | ICD-10-CM | POA: Diagnosis not present

## 2019-12-30 DIAGNOSIS — R5383 Other fatigue: Secondary | ICD-10-CM

## 2019-12-30 DIAGNOSIS — R7301 Impaired fasting glucose: Secondary | ICD-10-CM

## 2019-12-30 MED ORDER — NAPROXEN 500 MG PO TABS: 500.0000 mg | ORAL_TABLET | Freq: Two times a day (BID) | ORAL | 0 refills | Status: DC

## 2019-12-30 MED ORDER — COLCHICINE 0.6 MG PO CAPS: 1.0000 | ORAL_CAPSULE | Freq: Two times a day (BID) | ORAL | 0 refills | Status: DC

## 2019-12-30 NOTE — Assessment & Plan Note (Signed)
Last hemoglobin A1c was mildly elevated at 6.0 back in March.  Due to recheck those labs today hopefully it stable if not improved.  Can address further once labs return.

## 2019-12-30 NOTE — Assessment & Plan Note (Signed)
Unclear if somehow related to the pericarditis.  We did discuss maximizing her medication by doing Naprosyn 500 mg twice a day.  Prescription strength sent to pharmacy since she does get some mild relief with that.  We also discussed eating a low inflammatory diet by cutting out carbs and processed foods and sticking mainly to vegetables and lean proteins and lots of water for hydration.  We will also do some additional labs including rechecking her sed rate which was mildly elevated in the emergency department, recheck electrolytes, check for thyroid disorder, check for gout with a uric acid level as well as an ANA to evaluate for possible lupus.  I do not see any signs of swollen joints to indicate rheumatoid arthritis or psoriatic arthritis.  But she has had significant fatigue.  Also do a urinalysis to rule out proteinuria and hematuria.  She would also like to have her hemoglobin A1c updated as well.

## 2019-12-30 NOTE — Assessment & Plan Note (Signed)
Unclear etiology at this point but seems to have occurred fairly quickly after second Covid vaccine so certainly could be related there have been some case reports of pericarditis and myocarditis.  Fortunately she has not had any signs or symptoms of myocarditis.  I will be happy to switch her colchicine to Mitigare, since that is what her insurance will cover if she has any problems at the pharmacy then just please give [place] call back tomorrow so that we can make any changes needed.  Encouraged her to keep follow-up with cardiology which should be coming up soon.

## 2019-12-30 NOTE — Assessment & Plan Note (Signed)
Potassium was borderline low in the emergency department.  Like to recheck that to make sure that it stable.  This is a recurring issue for her.

## 2019-12-30 NOTE — Progress Notes (Signed)
Established Patient Office Visit  Subjective:  Patient ID: DNIYA NEUHAUS, female    DOB: January 11, 1981  Age: 39 y.o. MRN: [id number]  CC:  Chief Complaint  Patient presents with  . Hospitalization Follow-up    Pericarditis  . Generalized Body Aches    x 1 week    HPI Judy Marshall presents for hospital follow-up for recent diagnosis of pericarditis.  Went to the emergency department on September 13 for midline sternal pain that was worse with lying down and better with sitting up, that started the day prior.  She had received her second Covid vaccine 3 days prior to presenting to the emergency department.  Interestingly she had actually had a prior history of pretty severe pericarditis that was likely viral induced back in 2009 that was treated with prednisone and colchicine.  In the emergency department her potassium was a little bit low at 3.3.  She does have a history of hypokalemia.  Her sed rate was slightly elevated at 46.  Troponins were within normal range.  Magnesium was normal pregnancy test was negative.  Chest x-ray was normal.  CT angiogram also performed.  Negative for pulmonary embolism she was noted to have a small pericardial effusion.  She was given a prescription for colchicine and prednisone.  She never actually took the prednisone.  She actually followed up the next day with her cardiologist Dr. Miguel Rota.  He actually scheduled her for an echocardiogram last Friday on October 1.  According to her report everything looked good on that.  In regards to her chest pain she says that actually is feeling much better.  But she is here today because she also started having severe joint pain on Tuesday around September 28.  She said it was severe and all over her body affecting multiple joints in the upper and lower extremities.  She rated her pain a 10 out of 10.  She said it was severe enough that she almost went back to the emergency department she is also just felt  extremely fatigued.  That is when she actually called the cardiologist to let them know and they had recommended the echocardiogram which she had on Friday.  No prior family history of any type of autoimmune disorders such as rheumatoid, lupus etc.  Today she says her pain is not as severe and rates it an 8 out of 10.  She has not noticed any joint swelling or redness but is concerned about the possibility of gout.  She also wanted to to let me know that the pharmacy will not fill her colchicine the way it was written.  They will only fill it as a mitigare.  She also tried taking ibuprofen for pain and joint relief and says it really was not helpful so she switched to Aleve she is been taking 1 tab daily and that has helped some.  She says it takes the edge off slightly.  She also wants to know if she can get an exemption for work for having to have a flu vaccine.   Past Medical History:  Diagnosis Date  . Broken bones 1989   Left elbow  . IBS (irritable bowel syndrome)   . Pericarditis 2009    Past Surgical History:  Procedure Laterality Date  . Left arm fracture  1989  . TONSILLECTOMY  2004    Family History  Problem Relation Age of Onset  . Hypertension Mother   . Stomach cancer Maternal Grandmother   .  Non-Hodgkin's lymphoma Maternal Grandfather   . Stroke Paternal Grandfather     Social History   Socioeconomic History  . Marital status: Married    Spouse name: Holley Dexter  . Number of children: 0  . Years of education: Not on file  . Highest education level: Not on file  Occupational History  . Occupation: Database administrator: [employer]    Comment: [place] [employer]    Employer: [employer]  Tobacco Use  . Smoking status: Never Smoker  . Smokeless tobacco: Never Used  Vaping Use  . Vaping Use: Never used  Substance and Sexual Activity  . Alcohol use: No  . Drug use: No  . Sexual activity: Yes    Partners: Male    Birth control/protection: Pill   Other Topics Concern  . Not on file  Social History Narrative   Not on an exercise program.   Social Determinants of Health   Financial Resource Strain:   . Difficulty of Paying Living Expenses: Not on file  Food Insecurity:   . Worried About Programme researcher, broadcasting/film/video in the Last Year: Not on file  . Ran Out of Food in the Last Year: Not on file  Transportation Needs:   . Lack of Transportation (Medical): Not on file  . Lack of Transportation (Non-Medical): Not on file  Physical Activity:   . Days of Exercise per Week: Not on file  . Minutes of Exercise per Session: Not on file  Stress:   . Feeling of Stress : Not on file  Social Connections:   . Frequency of Communication with Friends and Family: Not on file  . Frequency of Social Gatherings with Friends and Family: Not on file  . Attends Religious Services: Not on file  . Active Member of Clubs or Organizations: Not on file  . Attends Banker Meetings: Not on file  . Marital Status: Not on file  Intimate Partner Violence:   . Fear of Current or Ex-Partner: Not on file  . Emotionally Abused: Not on file  . Physically Abused: Not on file  . Sexually Abused: Not on file    Outpatient Medications Prior to Visit  Medication Sig Dispense Refill  . chlorthalidone (HYGROTON) 25 MG tablet Take 0.5 tablets (12.5 mg total) by mouth daily. PLEASE MAKE APPOINTMENT 90 tablet 0  . diazepam (VALIUM) 5 MG tablet TAKE 1 TABLET BY MOUTH ONE HOUR PRIOR TO PROCEDURE AND 1 TABLET ON ARRIVAL TO PROCEDURE    . levonorgestrel-ethinyl estradiol (LESSINA-28) 0.1-20 MG-MCG tablet Take 1 tablet by mouth daily. 28 tablet 11  . omeprazole (PRILOSEC) 20 MG capsule Take by mouth.    . potassium chloride (KLOR-CON) 10 MEQ tablet TAKE 1 TABLET BY MOUTH DAILY 90 tablet 3  . Probiotic Product (PROBIOTIC-10 PO) Take by mouth.    . colchicine 0.6 MG tablet Take 0.6 mg by mouth 2 (two) times daily.     No facility-administered medications prior to  visit.    No Known Allergies  ROS Review of Systems    Objective:    Physical Exam Constitutional:      Appearance: She is well-developed.  HENT:     Head: Normocephalic and atraumatic.  Cardiovascular:     Rate and Rhythm: Normal rate and regular rhythm.     Heart sounds: Normal heart sounds.  Pulmonary:     Effort: Pulmonary effort is normal.     Breath sounds: Normal breath sounds.  Musculoskeletal:  Comments: No swelling or redness of the joints in the hand and wrists.  Nontender on exam today normal range of motion.  Skin:    General: Skin is warm and dry.  Neurological:     Mental Status: She is alert and oriented to person, place, and time.  Psychiatric:        Behavior: Behavior normal.     BP 137/72 (BP Location: Right Arm, Patient Position: Sitting, Cuff Size: Large)   Pulse 75   Temp 99.4 F (37.4 C) (Oral)   Wt 272 lb (123.4 kg)   BMI 43.71 kg/m  Wt Readings from Last 3 Encounters:  12/30/19 272 lb (123.4 kg)  06/25/19 273 lb (123.8 kg)  03/24/19 275 lb (124.7 kg)     Health Maintenance Due  Topic Date Due  . Hepatitis C Screening  Never done    There are no preventive care reminders to display for this patient.  Lab Results  Component Value Date   TSH 2.80 08/30/2017   Lab Results  Component Value Date   WBC 8.6 08/30/2017   HGB 12.7 08/30/2017   HCT 37.4 08/30/2017   MCV 86.6 08/30/2017   PLT 327 08/30/2017   Lab Results  Component Value Date   NA 138 12/23/2018   K 3.3 (L) 01/06/2019   CO2 27 12/23/2018   GLUCOSE 106 (H) 12/23/2018   BUN 17 12/23/2018   CREATININE 0.81 12/23/2018   BILITOT 0.4 12/23/2018   ALKPHOS 69 07/25/2012   AST 18 12/23/2018   ALT 13 12/23/2018   PROT 6.8 12/23/2018   ALBUMIN 3.9 07/25/2012   CALCIUM 8.7 12/23/2018   Lab Results  Component Value Date   CHOL 139 12/23/2018   Lab Results  Component Value Date   HDL 41 (L) 12/23/2018   Lab Results  Component Value Date   LDLCALC 78  12/23/2018   Lab Results  Component Value Date   TRIG 117 12/23/2018   Lab Results  Component Value Date   CHOLHDL 3.4 12/23/2018   Lab Results  Component Value Date   HGBA1C 6.0 (A) 06/25/2019      Assessment & Plan:   Problem List Items Addressed This Visit      Cardiovascular and Mediastinum   Acute idiopathic pericarditis    Unclear etiology at this point but seems to have occurred fairly quickly after second Covid vaccine so certainly could be related there have been some case reports of pericarditis and myocarditis.  Fortunately she has not had any signs or symptoms of myocarditis.  I will be happy to switch her colchicine to Mitigare, since that is what her insurance will cover if she has any problems at the pharmacy then just please give [place] call back tomorrow so that we can make any changes needed.  Encouraged her to keep follow-up with cardiology which should be coming up soon.      Relevant Medications   Colchicine (MITIGARE) 0.6 MG CAPS   Other Relevant Orders   COMPLETE METABOLIC PANEL WITH GFR   CBC   Hemoglobin A1c   TSH   Sedimentation rate   C-reactive protein   Uric acid   ANA   Urinalysis, Routine w reflex microscopic     Endocrine   IFG (impaired fasting glucose)    Last hemoglobin A1c was mildly elevated at 6.0 back in March.  Due to recheck those labs today hopefully it stable if not improved.  Can address further once labs return.  Other   Polyarthralgia - Primary    Unclear if somehow related to the pericarditis.  We did discuss maximizing her medication by doing Naprosyn 500 mg twice a day.  Prescription strength sent to pharmacy since she does get some mild relief with that.  We also discussed eating a low inflammatory diet by cutting out carbs and processed foods and sticking mainly to vegetables and lean proteins and lots of water for hydration.  We will also do some additional labs including rechecking her sed rate which was mildly  elevated in the emergency department, recheck electrolytes, check for thyroid disorder, check for gout with a uric acid level as well as an ANA to evaluate for possible lupus.  I do not see any signs of swollen joints to indicate rheumatoid arthritis or psoriatic arthritis.  But she has had significant fatigue.  Also do a urinalysis to rule out proteinuria and hematuria.  She would also like to have her hemoglobin A1c updated as well.      Relevant Orders   COMPLETE METABOLIC PANEL WITH GFR   CBC   Hemoglobin A1c   TSH   Sedimentation rate   C-reactive protein   Uric acid   ANA   Urinalysis, Routine w reflex microscopic   Hypokalemia    Potassium was borderline low in the emergency department.  Like to recheck that to make sure that it stable.  This is a recurring issue for her.       Other Visit Diagnoses    Fatigue, unspecified type       Relevant Orders   COMPLETE METABOLIC PANEL WITH GFR   CBC   Hemoglobin A1c   TSH   Sedimentation rate   C-reactive protein   Uric acid   ANA   Urinalysis, Routine w reflex microscopic      Meds ordered this encounter  Medications  . Colchicine (MITIGARE) 0.6 MG CAPS    Sig: Take 1 capsule by mouth in the morning and at bedtime.    Dispense:  30 capsule    Refill:  0  . naproxen (NAPROSYN) 500 MG tablet    Sig: Take 1 tablet (500 mg total) by mouth 2 (two) times daily with a meal. PRN    Dispense:  60 tablet    Refill:  0    Follow-up: Return in about 2 weeks (around 01/13/2020) for check on symptoms.   Time spent in encounter 40 minutes including reviewing emergency department and cardiology notes.  Nani Gasseratherine Trynity Skousen, MD

## 2020-01-05 LAB — URINALYSIS, ROUTINE W REFLEX MICROSCOPIC
Bacteria, UA: NONE SEEN /HPF
Bilirubin Urine: NEGATIVE
Glucose, UA: NEGATIVE
Hgb urine dipstick: NEGATIVE
Hyaline Cast: NONE SEEN /LPF
Ketones, ur: NEGATIVE
Leukocytes,Ua: NEGATIVE
Nitrite: NEGATIVE
Protein, ur: NEGATIVE
RBC / HPF: NONE SEEN /HPF (ref 0–2)
Specific Gravity, Urine: 1.023 (ref 1.001–1.03)
WBC, UA: NONE SEEN /HPF (ref 0–5)
pH: <=5 (ref 5.0–8.0)

## 2020-01-05 LAB — HEMOGLOBIN A1C
Hgb A1c MFr Bld: 5.9 % of total Hgb — ABNORMAL HIGH (ref <5.7)
Mean Plasma Glucose: 123 (calc)
eAG (mmol/L): 6.8 (calc)

## 2020-01-05 LAB — CBC
HCT: 36.4 % (ref 35.0–45.0)
Hemoglobin: 12.1 g/dL (ref 11.7–15.5)
MCH: 28.5 pg (ref 27.0–33.0)
MCHC: 33.2 g/dL (ref 32.0–36.0)
MCV: 85.6 fL (ref 80.0–100.0)
MPV: 10.6 fL (ref 7.5–12.5)
Platelets: 332 10*3/uL (ref 140–400)
RBC: 4.25 10*6/uL (ref 3.80–5.10)
RDW: 14 % (ref 11.0–15.0)
WBC: 8.2 10*3/uL (ref 3.8–10.8)

## 2020-01-05 LAB — URIC ACID: Uric Acid, Serum: 5.7 mg/dL (ref 2.5–7.0)

## 2020-01-05 LAB — ANA: Anti Nuclear Antibody (ANA): POSITIVE — AB

## 2020-01-05 LAB — TSH: TSH: 2.61 mIU/L

## 2020-01-05 LAB — COMPLETE METABOLIC PANEL WITH GFR
AG Ratio: 1.3 (calc) (ref 1.0–2.5)
ALT: 11 U/L (ref 6–29)
AST: 13 U/L (ref 10–30)
Albumin: 3.9 g/dL (ref 3.6–5.1)
Alkaline phosphatase (APISO): 67 U/L (ref 31–125)
BUN: 20 mg/dL (ref 7–25)
CO2: 30 mmol/L (ref 20–32)
Calcium: 9 mg/dL (ref 8.6–10.2)
Chloride: 100 mmol/L (ref 98–110)
Creat: 0.82 mg/dL (ref 0.50–1.10)
GFR, Est African American: 105 mL/min/{1.73_m2} (ref >=60)
GFR, Est Non African American: 91 mL/min/{1.73_m2} (ref >=60)
Globulin: 3.1 g/dL (calc) (ref 1.9–3.7)
Glucose, Bld: 110 mg/dL (ref 65–139)
Potassium: 3 mmol/L — ABNORMAL LOW (ref 3.5–5.3)
Sodium: 138 mmol/L (ref 135–146)
Total Bilirubin: 0.3 mg/dL (ref 0.2–1.2)
Total Protein: 7 g/dL (ref 6.1–8.1)

## 2020-01-05 LAB — C-REACTIVE PROTEIN: CRP: 31.4 mg/L — ABNORMAL HIGH (ref <8.0)

## 2020-01-05 LAB — SEDIMENTATION RATE: Sed Rate: 34 mm/h — ABNORMAL HIGH (ref 0–20)

## 2020-01-05 LAB — ANTI-NUCLEAR AB-TITER (ANA TITER): ANA Titer 1: 1:320 {titer} — ABNORMAL HIGH

## 2020-01-06 ENCOUNTER — Encounter: Payer: Self-pay | Admitting: Family Medicine

## 2020-01-06 DIAGNOSIS — I300 Acute nonspecific idiopathic pericarditis: Secondary | ICD-10-CM

## 2020-01-06 DIAGNOSIS — M2550 Pain in unspecified joint: Secondary | ICD-10-CM

## 2020-01-06 DIAGNOSIS — R768 Other specified abnormal immunological findings in serum: Secondary | ICD-10-CM

## 2020-01-08 NOTE — Telephone Encounter (Signed)
Referral placed.

## 2020-01-13 ENCOUNTER — Encounter: Payer: Self-pay | Admitting: Family Medicine

## 2020-01-13 ENCOUNTER — Ambulatory Visit (INDEPENDENT_AMBULATORY_CARE_PROVIDER_SITE_OTHER): Payer: BC Managed Care – PPO | Admitting: Family Medicine

## 2020-01-13 VITALS — BP 132/69 | HR 74 | Ht 66.0 in | Wt 274.0 lb

## 2020-01-13 DIAGNOSIS — R5383 Other fatigue: Secondary | ICD-10-CM | POA: Diagnosis not present

## 2020-01-13 DIAGNOSIS — M2550 Pain in unspecified joint: Secondary | ICD-10-CM | POA: Diagnosis not present

## 2020-01-13 DIAGNOSIS — I300 Acute nonspecific idiopathic pericarditis: Secondary | ICD-10-CM | POA: Diagnosis not present

## 2020-01-13 DIAGNOSIS — R42 Dizziness and giddiness: Secondary | ICD-10-CM | POA: Diagnosis not present

## 2020-01-13 NOTE — Assessment & Plan Note (Signed)
Has consultation scheduled for Monday for further work-up and evaluation am not sure if some of his inflammation is secondary to the Covid vaccine or if she truly has an underlying autoimmune disorder that may have been pushed over the edge after vaccination.

## 2020-01-13 NOTE — Assessment & Plan Note (Signed)
Currently off a medic RA. Felt like it was causing side effects so holding on that for now she is feeling better.

## 2020-01-13 NOTE — Progress Notes (Signed)
Established Patient Office Visit  Subjective:  Patient ID: Judy Marshall, female    DOB: 12-05-1980  Age: 39 y.o. MRN: [id number]  CC:  Chief Complaint  Patient presents with  . Follow-up    HPI Judy Marshall presents for 2-week follow-up for arthralgia. I had seen her after recent hospital visit in September. After having had her Covid vaccine she started experiencing new onset symptoms including joint pain. We did additional work-up which revealed an elevated ANA with a ratio of 1:320 with the pattern that was nuclear with dense fine speckling. It is actually rarely associated with lupus but because levels were so high I recommended consultation with rheumatology. She has an appointment scheduled on Monday with [facility].  She does report feeling a little better than she was when I last saw her 2 weeks ago she still having a lot of pain particularly in her hands, wrists neck shoulders and toes. She says the naproxen really doesn't seem to be helping like it did when the pain was more severe. She has never experienced a malar rash. She says her fatigue is still present but not as severe. She says she was actually able to ride her bike on Sunday and did well overall.  Follow-up acute pericarditis-she did start the medic RA but says that she was starting to notice that she was feeling dizzy and was like the room was spinning. She read the side effect profile and was concerned so stopped it. She has been off of it for 3 days now and says she feels like she is gradually getting better each day. He also felt like it was causing a weakness in her arms which again seems to be getting a little better.  Past Medical History:  Diagnosis Date  . Broken bones 1989   Left elbow  . IBS (irritable bowel syndrome)   . Pericarditis 2009    Past Surgical History:  Procedure Laterality Date  . Left arm fracture  1989  . TONSILLECTOMY  2004    Family History  Problem Relation Age  of Onset  . Hypertension Mother   . Stomach cancer Maternal Grandmother   . Non-Hodgkin's lymphoma Maternal Grandfather   . Stroke Paternal Grandfather     Social History   Socioeconomic History  . Marital status: Married    Spouse name: Holley Dexter  . Number of children: 0  . Years of education: Not on file  . Highest education level: Not on file  Occupational History  . 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 Smoking status: Never Smoker  . Smokeless tobacco: Never Used  Vaping Use  . Vaping Use: Never used  Substance and Sexual Activity  . Alcohol use: No  . Drug use: No  . Sexual activity: Yes    Partners: Male    Birth control/protection: Pill  Other Topics Concern  . Not on file  Social History Narrative   Not on an exercise program.   Social Determinants of Health   Financial Resource Strain:   . Difficulty of Paying Living Expenses: Not on file  Food Insecurity:   . Worried About Programme researcher, broadcasting/film/video in the Last Year: Not on file  . Ran Out of Food in the Last Year: Not on file  Transportation Needs:   . Lack of Transportation (Medical): Not on file  .  Lack of Transportation (Non-Medical): Not on file  Physical Activity:   . Days of Exercise per Week: Not on file  . Minutes of Exercise per Session: Not on file  Stress:   . Feeling of Stress : Not on file  Social Connections:   . Frequency of Communication with Friends and Family: Not on file  . Frequency of Social Gatherings with Friends and Family: Not on file  . Attends Religious Services: Not on file  . Active Member of Clubs or Organizations: Not on file  . Attends Banker Meetings: Not on file  . Marital Status: Not on file  Intimate Partner Violence:   . Fear of Current or Ex-Partner: Not on file  . Emotionally Abused: Not on file  . Physically Abused: Not on file  . Sexually Abused: Not on  file    Outpatient Medications Prior to Visit  Medication Sig Dispense Refill  . chlorthalidone (HYGROTON) 25 MG tablet Take 0.5 tablets (12.5 mg total) by mouth daily. PLEASE MAKE APPOINTMENT 90 tablet 0  . Colchicine (MITIGARE) 0.6 MG CAPS Take 1 capsule by mouth in the morning and at bedtime. 30 capsule 0  . levonorgestrel-ethinyl estradiol (LESSINA-28) 0.1-20 MG-MCG tablet Take 1 tablet by mouth daily. 28 tablet 11  . naproxen (NAPROSYN) 500 MG tablet Take 1 tablet (500 mg total) by mouth 2 (two) times daily with a meal. PRN 60 tablet 0  . omeprazole (PRILOSEC) 20 MG capsule Take by mouth.    . potassium chloride (KLOR-CON) 10 MEQ tablet TAKE 1 TABLET BY MOUTH DAILY 90 tablet 3  . Probiotic Product (PROBIOTIC-10 PO) Take by mouth.    . diazepam (VALIUM) 5 MG tablet TAKE 1 TABLET BY MOUTH ONE HOUR PRIOR TO PROCEDURE AND 1 TABLET ON ARRIVAL TO PROCEDURE     No facility-administered medications prior to visit.    No Known Allergies  ROS Review of Systems    Objective:    Physical Exam Vitals reviewed.  Constitutional:      Appearance: She is well-developed.  HENT:     Head: Normocephalic and atraumatic.  Eyes:     Conjunctiva/sclera: Conjunctivae normal.  Cardiovascular:     Rate and Rhythm: Normal rate.  Pulmonary:     Effort: Pulmonary effort is normal.  Skin:    General: Skin is dry.     Coloration: Skin is not pale.  Neurological:     Mental Status: She is alert and oriented to person, place, and time.  Psychiatric:        Behavior: Behavior normal.     BP 132/69   Pulse 74   Ht 5\' 6"  (1.676 m)   Wt 274 lb (124.3 kg)   SpO2 100%   BMI 44.22 kg/m  Wt Readings from Last 3 Encounters:  01/13/20 274 lb (124.3 kg)  12/30/19 272 lb (123.4 kg)  06/25/19 273 lb (123.8 kg)     Health Maintenance Due  Topic Date Due  . Hepatitis C Screening  Never done    There are no preventive care reminders to display for this patient.  Lab Results  Component Value  Date   TSH 2.61 12/30/2019   Lab Results  Component Value Date   WBC 8.2 12/30/2019   HGB 12.1 12/30/2019   HCT 36.4 12/30/2019   MCV 85.6 12/30/2019   PLT 332 12/30/2019   Lab Results  Component Value Date   NA 138 12/30/2019   K 3.0 (L) 12/30/2019   CO2 30  12/30/2019   GLUCOSE 110 12/30/2019   BUN 20 12/30/2019   CREATININE 0.82 12/30/2019   BILITOT 0.3 12/30/2019   ALKPHOS 69 07/25/2012   AST 13 12/30/2019   ALT 11 12/30/2019   PROT 7.0 12/30/2019   ALBUMIN 3.9 07/25/2012   CALCIUM 9.0 12/30/2019   Lab Results  Component Value Date   CHOL 139 12/23/2018   Lab Results  Component Value Date   HDL 41 (L) 12/23/2018   Lab Results  Component Value Date   LDLCALC 78 12/23/2018   Lab Results  Component Value Date   TRIG 117 12/23/2018   Lab Results  Component Value Date   CHOLHDL 3.4 12/23/2018   Lab Results  Component Value Date   HGBA1C 5.9 (H) 12/30/2019      Assessment & Plan:   Problem List Items Addressed This Visit      Cardiovascular and Mediastinum   Acute idiopathic pericarditis    Currently off a medic RA. Felt like it was causing side effects so holding on that for now she is feeling better.        Other   Polyarthralgia    Has consultation scheduled for Monday for further work-up and evaluation am not sure if some of his inflammation is secondary to the Covid vaccine or if she truly has an underlying autoimmune disorder that may have been pushed over the edge after vaccination.       Other Visit Diagnoses    Dizziness    -  Primary   Fatigue, unspecified type         Fatigue-seems to be improving slightly. This is reassuring.  Dizziness-could be secondary to the medication which she has now stopped and seems to be getting better. It does sound like it was most like room spinning so also consider benign positional vertigo as a cause.  No orders of the defined types were placed in this encounter.   Follow-up: No follow-ups on  file.    Nani Gasser, MD

## 2020-01-19 DIAGNOSIS — R768 Other specified abnormal immunological findings in serum: Secondary | ICD-10-CM | POA: Diagnosis not present

## 2020-01-21 DIAGNOSIS — R42 Dizziness and giddiness: Secondary | ICD-10-CM | POA: Diagnosis not present

## 2020-01-21 DIAGNOSIS — R9431 Abnormal electrocardiogram [ECG] [EKG]: Secondary | ICD-10-CM | POA: Diagnosis not present

## 2020-01-21 DIAGNOSIS — R202 Paresthesia of skin: Secondary | ICD-10-CM | POA: Diagnosis not present

## 2020-02-02 DIAGNOSIS — M2550 Pain in unspecified joint: Secondary | ICD-10-CM | POA: Diagnosis not present

## 2020-02-02 DIAGNOSIS — I300 Acute nonspecific idiopathic pericarditis: Secondary | ICD-10-CM | POA: Diagnosis not present

## 2020-02-02 DIAGNOSIS — R5383 Other fatigue: Secondary | ICD-10-CM | POA: Diagnosis not present

## 2020-02-02 DIAGNOSIS — R768 Other specified abnormal immunological findings in serum: Secondary | ICD-10-CM | POA: Diagnosis not present

## 2020-02-12 ENCOUNTER — Other Ambulatory Visit: Payer: Self-pay

## 2020-02-12 ENCOUNTER — Encounter: Payer: Self-pay | Admitting: Family Medicine

## 2020-02-12 ENCOUNTER — Ambulatory Visit (INDEPENDENT_AMBULATORY_CARE_PROVIDER_SITE_OTHER): Payer: BC Managed Care – PPO | Admitting: Family Medicine

## 2020-02-12 VITALS — BP 131/74 | HR 71 | Ht 66.0 in | Wt 273.0 lb

## 2020-02-12 DIAGNOSIS — E876 Hypokalemia: Secondary | ICD-10-CM

## 2020-02-12 DIAGNOSIS — M329 Systemic lupus erythematosus, unspecified: Secondary | ICD-10-CM | POA: Diagnosis not present

## 2020-02-12 DIAGNOSIS — R5383 Other fatigue: Secondary | ICD-10-CM | POA: Diagnosis not present

## 2020-02-12 NOTE — Assessment & Plan Note (Signed)
New diagnosis of lupus by Dr. Lanell Matar at [facility] rheumatology.  She has had a good experience with him but would like to transfer her care to [facility].  We will go ahead and place referral I think they are booked out until end of January early February.  Continue with hydroxychloroquine for now.

## 2020-02-12 NOTE — Progress Notes (Signed)
Established Patient Office Visit  Subjective:  Patient ID: Judy Marshall, female    DOB: 1980/11/18  Age: 39 y.o. MRN: [id number]  CC:  Chief Complaint  Patient presents with  . Follow-up    HPI Judy Marshall presents for new dx of Lupus.  She was seen by Dr. Lanell Matar.  She has been started on hydroxychloroquine once daily, though supposed to be on 2 tab daily.  She has noticed some improvement in her joint pain and especially in her fatigue she was like it has gotten a little better overall.  She said she was also given a prescription for prednisone but says she has not filled it yet because the cardiologist had told her not to take prednisone when she was first diagnosed with acute pericarditis back in September after receiving her Covid vaccine..  She reports that she had been having episodes of bilateral upper and lower extremity weakness numbness and while at work had an episode of feeling "funny" she says it was not truly like a vertigo type sensation she just felt hot and off balance.  It started while she was on a soon call at work and really increased her anxiety so she went to the emergency department.  I checked her blood pressure which was normal had an EKG which was also normal.  They did find that her potassium was low.  It had also been low previously and October she was supposed to increase her potassium to 2 tabs daily but was a little hesitant so stated 1 tab daily.  Again potassium was still low she says that she has started taking it twice a day since then and does feel like it is been helpful.  Past Medical History:  Diagnosis Date  . Broken bones 1989   Left elbow  . IBS (irritable bowel syndrome)   . Pericarditis 2009    Past Surgical History:  Procedure Laterality Date  . Left arm fracture  1989  . TONSILLECTOMY  2004    Family History  Problem Relation Age of Onset  . Hypertension Mother   . Stomach cancer Maternal Grandmother   . Non-Hodgkin's  lymphoma Maternal Grandfather   . Stroke Paternal Grandfather     Social History   Socioeconomic History  . Marital status: Married    Spouse name: Holley Dexter  . Number of children: 0  . Years of education: Not on file  . Highest education level: Not on file  Occupational History  . Occupation: Database administrator: [employer]    Comment: [employer]    Employer: [employer]  Tobacco Use  . Smoking status: Never Smoker  . Smokeless tobacco: Never Used  Vaping Use  . Vaping Use: Never used  Substance and Sexual Activity  . Alcohol use: No  . Drug use: No  . Sexual activity: Yes    Partners: Male    Birth control/protection: Pill  Other Topics Concern  . Not on file  Social History Narrative   Not on an exercise program.   Social Determinants of Health   Financial Resource Strain:   . Difficulty of Paying Living Expenses: Not on file  Food Insecurity:   . Worried About Programme researcher, broadcasting/film/video in the Last Year: Not on file  . Ran Out of Food in the Last Year: Not on file  Transportation Needs:   . Lack of Transportation (Medical): Not on file  . Lack of Transportation (Non-Medical):  Not on file  Physical Activity:   . Days of Exercise per Week: Not on file  . Minutes of Exercise per Session: Not on file  Stress:   . Feeling of Stress : Not on file  Social Connections:   . Frequency of Communication with Friends and Family: Not on file  . Frequency of Social Gatherings with Friends and Family: Not on file  . Attends Religious Services: Not on file  . Active Member of Clubs or Organizations: Not on file  . Attends Banker Meetings: Not on file  . Marital Status: Not on file  Intimate Partner Violence:   . Fear of Current or Ex-Partner: Not on file  . Emotionally Abused: Not on file  . Physically Abused: Not on file  . Sexually Abused: Not on file    Outpatient Medications Prior to Visit  Medication Sig Dispense Refill  .  chlorthalidone (HYGROTON) 25 MG tablet Take 0.5 tablets (12.5 mg total) by mouth daily. PLEASE MAKE APPOINTMENT 90 tablet 0  . hydroxychloroquine (PLAQUENIL) 200 MG tablet Take 200 mg by mouth once.  1  . levonorgestrel-ethinyl estradiol (LESSINA-28) 0.1-20 MG-MCG tablet Take 1 tablet by mouth daily. 28 tablet 11  . naproxen (NAPROSYN) 500 MG tablet Take 1 tablet (500 mg total) by mouth 2 (two) times daily with a meal. PRN 60 tablet 0  . omeprazole (PRILOSEC) 20 MG capsule Take by mouth.    . potassium chloride (KLOR-CON) 10 MEQ tablet TAKE 1 TABLET BY MOUTH DAILY (Patient taking differently: Take 20 mEq by mouth once. ) 90 tablet 3  . Probiotic Product (PROBIOTIC-10 PO) Take by mouth.    . Colchicine (MITIGARE) 0.6 MG CAPS Take 1 capsule by mouth in the morning and at bedtime. 30 capsule 0   No facility-administered medications prior to visit.    No Known Allergies  ROS Review of Systems    Objective:    Physical Exam  BP 131/74   Pulse 71   Ht 5\' 6"  (1.676 m)   Wt 273 lb (123.8 kg)   SpO2 100%   BMI 44.06 kg/m  Wt Readings from Last 3 Encounters:  02/12/20 273 lb (123.8 kg)  01/13/20 274 lb (124.3 kg)  12/30/19 272 lb (123.4 kg)     Health Maintenance Due  Topic Date Due  . Hepatitis C Screening  Never done    There are no preventive care reminders to display for this patient.  Lab Results  Component Value Date   TSH 2.61 12/30/2019   Lab Results  Component Value Date   WBC 8.2 12/30/2019   HGB 12.1 12/30/2019   HCT 36.4 12/30/2019   MCV 85.6 12/30/2019   PLT 332 12/30/2019   Lab Results  Component Value Date   NA 138 12/30/2019   K 3.0 (L) 12/30/2019   CO2 30 12/30/2019   GLUCOSE 110 12/30/2019   BUN 20 12/30/2019   CREATININE 0.82 12/30/2019   BILITOT 0.3 12/30/2019   ALKPHOS 69 07/25/2012   AST 13 12/30/2019   ALT 11 12/30/2019   PROT 7.0 12/30/2019   ALBUMIN 3.9 07/25/2012   CALCIUM 9.0 12/30/2019   Lab Results  Component Value Date    CHOL 139 12/23/2018   Lab Results  Component Value Date   HDL 41 (L) 12/23/2018   Lab Results  Component Value Date   LDLCALC 78 12/23/2018   Lab Results  Component Value Date   TRIG 117 12/23/2018   Lab Results  Component  Value Date   CHOLHDL 3.4 12/23/2018   Lab Results  Component Value Date   HGBA1C 5.9 (H) 12/30/2019      Assessment & Plan:   Problem List Items Addressed This Visit      Musculoskeletal and Integument   SLE (systemic lupus erythematosus related syndrome) (HCC)    New diagnosis of lupus by Dr. Lanell Matar at [facility] rheumatology.  She has had a good experience with him but would like to transfer her care to [facility].  We will go ahead and place referral I think they are booked out until end of January early February.  Continue with hydroxychloroquine for now.      Relevant Orders   Ambulatory referral to Rheumatology     Other   Hypokalemia - Primary    Check BMP today.  Discussed options including discontinuing her chlorthalidone and switching to an ACE or ARB for her blood pressure instead which would not affect her potassium which I suspect is the most likely culprit or continue with potassium as she is if she likes the chlorthalidone.  She will think about it and let me know soon as we get her labs back.      Relevant Orders   BASIC METABOLIC PANEL WITH GFR    Other Visit Diagnoses    Other fatigue          No orders of the defined types were placed in this encounter.   Follow-up: No follow-ups on file.    Nani Gasser, MD

## 2020-02-12 NOTE — Assessment & Plan Note (Signed)
Check BMP today.  Discussed options including discontinuing her chlorthalidone and switching to an ACE or ARB for her blood pressure instead which would not affect her potassium which I suspect is the most likely culprit or continue with potassium as she is if she likes the chlorthalidone.  She will think about it and let me know soon as we get her labs back.

## 2020-02-13 ENCOUNTER — Other Ambulatory Visit: Payer: Self-pay | Admitting: Family Medicine

## 2020-02-13 ENCOUNTER — Other Ambulatory Visit: Payer: Self-pay | Admitting: *Deleted

## 2020-02-13 ENCOUNTER — Encounter: Payer: Self-pay | Admitting: Family Medicine

## 2020-02-13 DIAGNOSIS — E876 Hypokalemia: Secondary | ICD-10-CM

## 2020-02-13 LAB — BASIC METABOLIC PANEL WITH GFR
BUN: 20 mg/dL (ref 7–25)
CO2: 30 mmol/L (ref 20–32)
Calcium: 9 mg/dL (ref 8.6–10.2)
Chloride: 100 mmol/L (ref 98–110)
Creat: 0.73 mg/dL (ref 0.50–1.10)
GFR, Est African American: 121 mL/min/{1.73_m2} (ref >=60)
GFR, Est Non African American: 104 mL/min/{1.73_m2} (ref >=60)
Glucose, Bld: 102 mg/dL — ABNORMAL HIGH (ref 65–99)
Potassium: 3.2 mmol/L — ABNORMAL LOW (ref 3.5–5.3)
Sodium: 139 mmol/L (ref 135–146)

## 2020-02-13 MED ORDER — LISINOPRIL 10 MG PO TABS: 10.0000 mg | ORAL_TABLET | Freq: Every day | ORAL | 0 refills | Status: DC

## 2020-02-13 NOTE — Addendum Note (Signed)
Addended by: Nani Gasser D on: 02/13/2020 05:58 PM   Modules accepted: Orders

## 2020-02-18 ENCOUNTER — Encounter: Payer: Self-pay | Admitting: Family Medicine

## 2020-02-18 NOTE — Telephone Encounter (Signed)
Arline Asp can you notify patient once you get additional information for her

## 2020-02-23 ENCOUNTER — Other Ambulatory Visit: Payer: Self-pay | Admitting: Family Medicine

## 2020-03-01 DIAGNOSIS — T881XXA Other complications following immunization, not elsewhere classified, initial encounter: Secondary | ICD-10-CM | POA: Diagnosis not present

## 2020-03-01 DIAGNOSIS — Z862 Personal history of diseases of the blood and blood-forming organs and certain disorders involving the immune mechanism: Secondary | ICD-10-CM | POA: Diagnosis not present

## 2020-03-02 ENCOUNTER — Encounter: Payer: Self-pay | Admitting: Family Medicine

## 2020-03-05 DIAGNOSIS — E876 Hypokalemia: Secondary | ICD-10-CM | POA: Diagnosis not present

## 2020-03-05 MED ORDER — POTASSIUM CHLORIDE ER 20 MEQ PO TBCR
20.0000 meq | EXTENDED_RELEASE_TABLET | Freq: Every day | ORAL | 1 refills | Status: DC
Start: 2020-03-05 — End: 2020-07-15

## 2020-03-06 LAB — POTASSIUM: Potassium: 3.4 mmol/L — ABNORMAL LOW (ref 3.5–5.3)

## 2020-03-07 NOTE — Addendum Note (Signed)
Addended by: Nani Gasser D on: 03/07/2020 04:43 PM   Modules accepted: Orders

## 2020-03-09 ENCOUNTER — Other Ambulatory Visit: Payer: Self-pay | Admitting: *Deleted

## 2020-03-09 ENCOUNTER — Encounter: Payer: Self-pay | Admitting: *Deleted

## 2020-03-09 DIAGNOSIS — Z3009 Encounter for other general counseling and advice on contraception: Secondary | ICD-10-CM

## 2020-03-09 MED ORDER — LEVONORGESTREL-ETHINYL ESTRAD 0.1-20 MG-MCG PO TABS: 1.0000 | ORAL_TABLET | Freq: Every day | ORAL | 1 refills | Status: DC

## 2020-03-09 NOTE — Telephone Encounter (Signed)
Pt called to make appt for her annual with Dr Earlene Plater in January.  Requesting RF on OCP until her appt.  RF sent to [facility] in [place].

## 2020-03-23 ENCOUNTER — Telehealth (INDEPENDENT_AMBULATORY_CARE_PROVIDER_SITE_OTHER): Payer: BC Managed Care – PPO | Admitting: Family Medicine

## 2020-03-23 ENCOUNTER — Encounter: Payer: Self-pay | Admitting: Family Medicine

## 2020-03-23 VITALS — Temp 99.6°F

## 2020-03-23 DIAGNOSIS — M2550 Pain in unspecified joint: Secondary | ICD-10-CM

## 2020-03-23 DIAGNOSIS — I10 Essential (primary) hypertension: Secondary | ICD-10-CM

## 2020-03-23 DIAGNOSIS — U071 COVID-19: Secondary | ICD-10-CM

## 2020-03-23 MED ORDER — CHLORTHALIDONE 25 MG PO TABS: 12.5000 mg | ORAL_TABLET | Freq: Every day | ORAL | 3 refills | Status: DC

## 2020-03-23 NOTE — Progress Notes (Signed)
Virtual Visit via Video Note  I connected with Judy Marshall on 03/23/20 at  1:20 PM EST by a video enabled telemedicine application and verified that I am speaking with the correct person using two identifiers.   I discussed the limitations of evaluation and management by telemedicine and the availability of in person appointments. The patient expressed understanding and agreed to proceed.  Patient location: at home Provider location: in office  Subjective:    CC: COVID +   HPI:  She did the home test on yesterday.  She reports that her sxs began 4 days ago Denies sick contacts. Fever was 99.6 last night, lost smell and taste last night, nasal drainage, nausea. Feels less congested now. Mild cough from drainage.    Stated that her husband tested neg yesterday but started having some SOB so she took him to the hospital and he tested positive today. Believes that he contracted this from someone at work.   She will need a work note. 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    Past medical history, Surgical history, Family history not pertinant except as noted below, Social history, Allergies, and medications have been entered into the medical record, reviewed, and corrections made.   Review of Systems: No fevers, chills, night sweats, weight loss, chest pain, or shortness of breath.   Objective:    General: Speaking clearly in complete sentences without any shortness of breath.  Alert and oriented x3.  Normal judgment. No apparent acute distress.    Impression and Recommendations:    Polyarthralgia Evaluated for lupus at [facility].  They felt she did not meet  the diagnostic criteria and felt like it was secondary to her Covid vaccine.  She actually is feeling better and is now off of Plaquenil.  COVID-19-discussed symptoms and symptomatic care.  Discussed updated guidelines for quarantine.  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  Has been is now ill as well but he is back home and doing okay.  Call if not better in a week or if worsening symptoms.  Hypertension-also refilled her chlorthalidone she said she called the pharmacy and they did not receive approval.  It looked like somehow it was removed from her medication list so I put it back on there and refill the medicine.  Polyarthralgia Evaluated for lupus at [facility].  They felt she did not meet the diagnostic criteria and felt like it was secondary to her Covid vaccine.  She actually is feeling better and is now off of Plaquenil.     Time spent in encounter 20 minutes  I discussed the assessment and treatment plan with the patient. The patient was provided an opportunity to ask questions and all were answered. The patient agreed with the plan and demonstrated an understanding of the instructions.   The patient was advised to call back or seek an in-person evaluation if the symptoms worsen or if the condition fails to improve as anticipated.   Nani Gasser, MD

## 2020-03-23 NOTE — Assessment & Plan Note (Signed)
Evaluated for lupus at [facility].  They felt she did not meet the diagnostic criteria and felt like it was secondary to her Covid vaccine.  She actually is feeling better and is now off of Plaquenil.

## 2020-03-23 NOTE — Progress Notes (Signed)
She did the home test on yesterday.  She reports that her sxs began 4 days ago Denies sick contacts. Fever was 99.6 last night, lost smell and taste last night, nasal drainage, nausea,     Stated that her husband tested neg yesterday but started having some SOB so she took him to the hospital and he tested positive today. Believes that he contracted this from someone at work.     She will need a work note. 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

## 2020-04-01 ENCOUNTER — Encounter: Payer: Self-pay | Admitting: Family Medicine

## 2020-04-02 ENCOUNTER — Telehealth (INDEPENDENT_AMBULATORY_CARE_PROVIDER_SITE_OTHER): Payer: PRIVATE HEALTH INSURANCE | Admitting: Medical-Surgical

## 2020-04-02 DIAGNOSIS — U071 COVID-19: Secondary | ICD-10-CM

## 2020-04-02 DIAGNOSIS — J019 Acute sinusitis, unspecified: Secondary | ICD-10-CM

## 2020-04-02 MED ORDER — FLUCONAZOLE 150 MG PO TABS: 150.0000 mg | ORAL_TABLET | Freq: Once | ORAL | 0 refills | Status: AC

## 2020-04-02 MED ORDER — AMOXICILLIN-POT CLAVULANATE 875-125 MG PO TABS: 1.0000 | ORAL_TABLET | Freq: Two times a day (BID) | ORAL | 0 refills | Status: DC

## 2020-04-02 NOTE — Telephone Encounter (Signed)
Pt ended up having a video visit today. Did she bring this up Joy?

## 2020-04-02 NOTE — Progress Notes (Signed)
Virtual Visit via Video Note  I connected with Judy Marshall on 04/02/20 at  8:30 AM EST by a video enabled telemedicine application and verified that I am speaking with the correct person using two identifiers.   I discussed the limitations of evaluation and management by telemedicine and the availability of in person appointments. The patient expressed understanding and agreed to proceed.  Patient location: home Provider locations: office  Subjective:    CC: COVID-19 follow-up  HPI: Pleasant 40 year old female presenting via MyChart video visit for follow-up on COVID.  She was diagnosed with COVID on 12/27 with a positive test.  Today notes that she is feeling some better but has noticed that her symptoms continue to be moderate.  She does have severe coughing spells which is causing a headache.  She has a sinus congestion and is able to blow her nose but her smell that had come back is now gone again.  She is able to taste okay.  Feels overall rundown and is having daily low-grade temperature elevations of 99-100.  Has lost 12 pounds since her illness began due to a decreased appetite but she is making sure to drink plenty of fluids.  Denies shortness of breath and chest pain.  Taking Mucinex cough and congestion along with ibuprofen as needed which is helpful for her symptoms.  Notes that her sinuses were painful over the last week but had improved until yesterday when they started to worsen again.  Reports her mom and her sister are both admitted in the hospital with COVID-pneumonia and she is very concerned that she may end up with pneumonia as well.  Past medical history, Surgical history, Family history not pertinant except as noted below, Social history, Allergies, and medications have been entered into the medical record, reviewed, and corrections made.   Review of Systems: See HPI for pertinent positives and negatives.   Objective:    General: Speaking clearly in complete  sentences without any shortness of breath.  Alert and oriented x3.  Normal judgment. No apparent acute distress.  Impression and Recommendations:    1. COVID-19 Illness appears to be progressing as expected.  No shortness of breath or chest pain to indicate progression into pneumonia at this time.  Continue OTC Mucinex and ibuprofen as needed.  Offered cough syrup to help sleep at night but the declined since the over-the-counter remedies seem to work pretty well for her.  Work note provided via MyChart to extend time out of work for another week.  2. Acute non-recurrent sinusitis, unspecified location With the improvement in her sinus congestion/pain followed by immediate worsening, we will go ahead and treat empirically for sinusitis with Augmentin twice daily x7 days.  She does get yeast infections when treated with antibiotics so sending in a prophylactic Diflucan.  I discussed the assessment and treatment plan with the patient. The patient was provided an opportunity to ask questions and all were answered. The patient agreed with the plan and demonstrated an understanding of the instructions.   The patient was advised to call back or seek an in-person evaluation if the symptoms worsen or if the condition fails to improve as anticipated.  20 minutes of non-face-to-face time was provided during this encounter.  Return if symptoms worsen or fail to improve.  Thayer Ohm, DNP, APRN, FNP-BC [facility]

## 2020-04-02 NOTE — Telephone Encounter (Signed)
Note pended to return 04/12/2020, please sign if appropriate.

## 2020-04-20 ENCOUNTER — Other Ambulatory Visit: Payer: Self-pay | Admitting: Obstetrics and Gynecology

## 2020-04-20 DIAGNOSIS — Z3009 Encounter for other general counseling and advice on contraception: Secondary | ICD-10-CM

## 2020-04-22 ENCOUNTER — Ambulatory Visit: Payer: BC Managed Care – PPO | Admitting: Obstetrics and Gynecology

## 2020-05-10 ENCOUNTER — Ambulatory Visit (INDEPENDENT_AMBULATORY_CARE_PROVIDER_SITE_OTHER): Payer: PRIVATE HEALTH INSURANCE | Admitting: Obstetrics & Gynecology

## 2020-05-10 ENCOUNTER — Encounter: Payer: Self-pay | Admitting: Obstetrics & Gynecology

## 2020-05-10 ENCOUNTER — Other Ambulatory Visit: Payer: Self-pay

## 2020-05-10 ENCOUNTER — Other Ambulatory Visit (HOSPITAL_COMMUNITY)
Admission: RE | Admit: 2020-05-10 | Discharge: 2020-05-10 | Disposition: A | Discharge status: Home / Self Care | Payer: PRIVATE HEALTH INSURANCE | Source: Ambulatory Visit | Attending: Obstetrics & Gynecology | Admitting: Obstetrics & Gynecology

## 2020-05-10 VITALS — BP 132/88 | HR 75 | Resp 16 | Ht 66.0 in | Wt 272.0 lb

## 2020-05-10 DIAGNOSIS — Z01419 Encounter for gynecological examination (general) (routine) without abnormal findings: Secondary | ICD-10-CM

## 2020-05-10 DIAGNOSIS — Z3009 Encounter for other general counseling and advice on contraception: Secondary | ICD-10-CM | POA: Diagnosis not present

## 2020-05-10 DIAGNOSIS — I10 Essential (primary) hypertension: Secondary | ICD-10-CM

## 2020-05-10 MED ORDER — LEVONORGESTREL-ETHINYL ESTRAD 0.1-20 MG-MCG PO TABS: 1.0000 | ORAL_TABLET | Freq: Every day | ORAL | 12 refills | Status: DC

## 2020-05-10 NOTE — Patient Instructions (Signed)

## 2020-05-11 NOTE — Progress Notes (Signed)
Subjective:     Judy Marshall is a 40 y.o. female here for a routine exam.  Current complaints: none; doing well on OCPs.  Does not desire children.      Gynecologic History Patient's last menstrual period was 04/29/2020. Contraception: OCP (estrogen/progesterone) Last Pap: 2019. Results were: normal Last mammogram: none at [facility], [place].   Obstetric History OB History  Gravida Para Term Preterm AB Living  0 0 0 0 0 0  SAB IAB Ectopic Multiple Live Births  0 0 0 0       The following portions of the patient's history were reviewed and updated as appropriate: allergies, current medications, past family history, past medical history, past social history, past surgical history and problem list.  Review of Systems Pertinent items noted in HPI and remainder of comprehensive ROS otherwise negative.    Objective:      Vitals:   05/10/20 1540 05/10/20 1622  BP: (!) 160/59 132/88  Pulse: 75   Resp: 16   Weight: 272 lb (123.4 kg)   Height: 5\' 6"  (1.676 m)    Vitals:  WNL General appearance: alert, cooperative and no distress  HEENT: Normocephalic, without obvious abnormality, atraumatic Eyes: negative Throat: lips, mucosa, and tongue normal; teeth and gums normal  Respiratory: Clear to auscultation bilaterally  CV: Regular rate and rhythm  Breasts:  Normal appearance, no masses or tenderness, no nipple retraction or dimpling  GI: Soft, non-tender; bowel sounds normal; no masses,  no organomegaly  GU: External Genitalia:  Tanner V, no lesion Urethra:  No prolapse   Vagina: Pink, normal rugae, no blood or discharge  Cervix: No CMT, no lesion  Uterus:  Normal size and contour, non tender, limited by habitus  Adnexa: Normal, no masses, non tender, limited by habitus  Musculoskeletal: No edema, redness or tenderness in the calves or thighs  Skin: No lesions or rash  Lymphatic: Axillary adenopathy: none     Psychiatric: Normal mood and behavior        Assessment:     Healthy female exam.    Plan:   1.  Pap with co testing 2.  White coat HTN--pt very nervous when coming to medical office after having pericarditis from Covid vaccine.  At end of visi, BP normal. 3.  Continue OCPs.  Discussed relative contraindication to OCP and HTN.  Discussed other forms of birth control and written information given.  Pt will discuss with husband and further discussion next year.  Pt understands she would need to discontinue combination OCPs if HTN was not controlled or she required more medications to control

## 2020-05-12 LAB — CYTOLOGY - PAP
Comment: NEGATIVE
Diagnosis: NEGATIVE
High risk HPV: NEGATIVE

## 2020-06-04 ENCOUNTER — Other Ambulatory Visit: Payer: Self-pay

## 2020-06-04 ENCOUNTER — Encounter: Payer: Self-pay | Admitting: Family Medicine

## 2020-06-04 ENCOUNTER — Ambulatory Visit (INDEPENDENT_AMBULATORY_CARE_PROVIDER_SITE_OTHER): Payer: PRIVATE HEALTH INSURANCE | Admitting: Family Medicine

## 2020-06-04 VITALS — BP 138/88 | HR 77 | Ht 66.0 in | Wt 271.0 lb

## 2020-06-04 DIAGNOSIS — E876 Hypokalemia: Secondary | ICD-10-CM

## 2020-06-04 DIAGNOSIS — M7989 Other specified soft tissue disorders: Secondary | ICD-10-CM | POA: Diagnosis not present

## 2020-06-04 DIAGNOSIS — I10 Essential (primary) hypertension: Secondary | ICD-10-CM

## 2020-06-04 DIAGNOSIS — R7301 Impaired fasting glucose: Secondary | ICD-10-CM | POA: Diagnosis not present

## 2020-06-04 NOTE — Assessment & Plan Note (Signed)
Plan to recheck potassium so we can adjust medication. She thinks that she is taking daily. May need to be on 40 meQ.

## 2020-06-04 NOTE — Progress Notes (Signed)
Established Patient Office Visit  Subjective:  Patient ID: Judy Marshall, female    DOB: [birth year]  Age: 40 y.o. MRN: [id number]  CC: No chief complaint on file.   HPI [name] presents for   Hypertension- Pt denies chest pain, SOB, dizziness, or heart palpitations.  Taking meds as directed w/o problems.  Denies medication side effects.  She is currently taking half a tab of chlorthalidone nightly and says it seems to be working well overall but she is noted that her blood pressures have oftentimes been in the low 130s.  She would like to reconsider lisinopril we had started it briefly and she felt like it was actually causing some dizziness but on reflection she most wonders if it was related to the low potassium she says when it gets really low she will noticed that she actually feels dizzy/swimmy headed.  And she will even start to get some weakness and maybe even some tingling in her forearms and hands.  She says it actually got bad enough that in the late fall she actually ended up going to the emergency department.  Everything checked out except for the fact that she had really low potassium at that time.  Hypokalemia-she thinks she is taking in the 10 mEq potassium chloride twice a day.  Though we had sent a prescription for 20s but she will check at home to see what she has.  Impaired fasting glucose-no increased thirst or urination. No symptoms consistent with hypoglycemia.   Past Medical History:  Diagnosis Date  . Broken bones 1989   Left elbow  . Hypertension   . IBS (irritable bowel syndrome)   . Pericarditis 2009  . Pericarditis   . Vaginal Pap smear, abnormal     Past Surgical History:  Procedure Laterality Date  . COLPOSCOPY    . Left arm fracture  1989  . TONSILLECTOMY  2004    Family History  Problem Relation Age of Onset  . Hypertension Mother   . Stomach cancer Maternal Grandmother   . Non-Hodgkin's lymphoma Maternal Grandfather   .  Stroke Paternal Grandfather     Social History   Socioeconomic History  . Marital status: Married    Spouse name: Holley Dexter  . Number of children: 0  . Years of education: Not on file  . Highest education level: Not on file  Occupational History  . Occupation: Database administrator: [employer]    Comment: [employer]    Employer: [employer]  Tobacco Use  . Smoking status: Never Smoker  . Smokeless tobacco: Never Used  Vaping Use  . Vaping Use: Never used  Substance and Sexual Activity  . Alcohol use: No  . Drug use: No  . Sexual activity: Yes    Partners: Male    Birth control/protection: Pill  Other Topics Concern  . Not on file  Social History Narrative   Not on an exercise program.   Social Determinants of Health   Financial Resource Strain: Not on file  Food Insecurity: Not on file  Transportation Needs: Not on file  Physical Activity: Not on file  Stress: Not on file  Social Connections: Not on file  Intimate Partner Violence: Not on file    Outpatient Medications Prior to Visit  Medication Sig Dispense Refill  . chlorthalidone (HYGROTON) 25 MG tablet Take 0.5 tablets (12.5 mg total) by mouth daily. 45 tablet 3  . levonorgestrel-ethinyl estradiol (VIENVA) 0.1-20 MG-MCG  tablet Take 1 tablet by mouth daily. 28 tablet 12  . omeprazole (PRILOSEC) 20 MG capsule Take by mouth.    . potassium chloride 20 MEQ TBCR Take 20 mEq by mouth daily. 90 tablet 1  . Probiotic Product (PROBIOTIC-10 PO) Take by mouth.     No facility-administered medications prior to visit.    No Active Allergies  ROS Review of Systems    Objective:    Physical Exam Constitutional:      Appearance: She is well-developed.  HENT:     Head: Normocephalic and atraumatic.  Cardiovascular:     Rate and Rhythm: Normal rate and regular rhythm.     Heart sounds: Normal heart sounds.  Pulmonary:     Effort: Pulmonary effort is normal.     Breath sounds: Normal  breath sounds.  Skin:    General: Skin is warm and dry.  Neurological:     Mental Status: She is alert and oriented to person, place, and time.  Psychiatric:        Behavior: Behavior normal.     BP 138/88   Pulse 77   Ht 5\' 6"  (1.676 m)   Wt 271 lb (122.9 kg)   SpO2 100%   BMI 43.74 kg/m  Wt Readings from Last 3 Encounters:  06/04/20 271 lb (122.9 kg)  05/10/20 272 lb (123.4 kg)  02/12/20 273 lb (123.8 kg)     Health Maintenance Due  Topic Date Due  . Hepatitis C Screening  Never done  . COVID-19 Vaccine (3 - Booster for Pfizer series) 06/03/2020    There are no preventive care reminders to display for this patient.  Lab Results  Component Value Date   TSH 2.61 12/30/2019   Lab Results  Component Value Date   WBC 8.2 12/30/2019   HGB 12.1 12/30/2019   HCT 36.4 12/30/2019   MCV 85.6 12/30/2019   PLT 332 12/30/2019   Lab Results  Component Value Date   NA 139 02/12/2020   K 3.4 (L) 03/05/2020   CO2 30 02/12/2020   GLUCOSE 102 (H) 02/12/2020   BUN 20 02/12/2020   CREATININE 0.73 02/12/2020   BILITOT 0.3 12/30/2019   ALKPHOS 69 07/25/2012   AST 13 12/30/2019   ALT 11 12/30/2019   PROT 7.0 12/30/2019   ALBUMIN 3.9 07/25/2012   CALCIUM 9.0 02/12/2020   Lab Results  Component Value Date   CHOL 139 12/23/2018   Lab Results  Component Value Date   HDL 41 (L) 12/23/2018   Lab Results  Component Value Date   LDLCALC 78 12/23/2018   Lab Results  Component Value Date   TRIG 117 12/23/2018   Lab Results  Component Value Date   CHOLHDL 3.4 12/23/2018   Lab Results  Component Value Date   HGBA1C 5.9 (H) 12/30/2019      Assessment & Plan:   Problem List Items Addressed This Visit      Cardiovascular and Mediastinum   Hypertension goal BP (blood pressure) < 130/80 - Primary    We discussed options she is open to trying another ACE inhibitor/ARB which could help retain potassium and help lower her blood pressures to get it at goal.  Think she  still has on lisinopril at home so she will try restarting a half of a tab which would be the equivalent of 5 mg daily in addition to her half a tab chlorthalidone working to try that for a couple weeks and see if that helps to  control her blood pressure she said ultimately she would like to try to get off of the diuretic.        Endocrine   IFG (impaired fasting glucose)    Will continue to monitor.    Lab Results  Component Value Date   HGBA1C 5.9 (H) 12/30/2019           Other   Hypokalemia    Plan to recheck potassium so we can adjust medication. She thinks that she is taking daily. May need to be on 40 meQ.        Relevant Orders   BASIC METABOLIC PANEL WITH GFR    Other Visit Diagnoses    Low serum potassium level       Relevant Orders   BASIC METABOLIC PANEL WITH GFR   Leg swelling          No orders of the defined types were placed in this encounter.   Follow-up: Return in about 1 month (around 07/05/2020) for Hypertension.    Nani Gasser, MD

## 2020-06-04 NOTE — Assessment & Plan Note (Signed)
We discussed options she is open to trying another ACE inhibitor/ARB which could help retain potassium and help lower her blood pressures to get it at goal.  Think she still has on lisinopril at home so she will try restarting a half of a tab which would be the equivalent of 5 mg daily in addition to her half a tab chlorthalidone working to try that for a couple weeks and see if that helps to control her blood pressure she said ultimately she would like to try to get off of the diuretic.

## 2020-06-04 NOTE — Assessment & Plan Note (Signed)
Will continue to monitor.    Lab Results  Component Value Date   HGBA1C 5.9 (H) 12/30/2019

## 2020-06-07 ENCOUNTER — Encounter: Payer: Self-pay | Admitting: Family Medicine

## 2020-06-29 ENCOUNTER — Encounter: Payer: Self-pay | Admitting: Family Medicine

## 2020-07-15 ENCOUNTER — Other Ambulatory Visit: Payer: Self-pay | Admitting: Family Medicine

## 2020-07-15 ENCOUNTER — Other Ambulatory Visit: Payer: Self-pay | Admitting: *Deleted

## 2020-07-15 MED ORDER — POTASSIUM CHLORIDE ER 20 MEQ PO TBCR: 20.0000 meq | EXTENDED_RELEASE_TABLET | Freq: Every day | ORAL | 1 refills | Status: DC

## 2020-07-30 ENCOUNTER — Telehealth: Payer: Self-pay

## 2020-07-30 MED ORDER — POTASSIUM CHLORIDE ER 20 MEQ PO TBCR: 20.0000 meq | EXTENDED_RELEASE_TABLET | Freq: Two times a day (BID) | ORAL | 0 refills | Status: DC

## 2020-07-30 NOTE — Telephone Encounter (Signed)
New rx sent

## 2020-07-30 NOTE — Telephone Encounter (Signed)
Pt called and stated that she is taking Potassium daily and was unable to get the Rx filled that was sent on 4/21. Pt states insurance will not pay for it since the sig states to take by mouth daily and it would be too early to refill. I contacted [facility] and confirmed. Pt is asking for a new script with the correct dosage and signature.

## 2020-08-05 ENCOUNTER — Other Ambulatory Visit: Payer: Self-pay | Admitting: Family Medicine

## 2020-11-03 ENCOUNTER — Other Ambulatory Visit: Payer: Self-pay | Admitting: *Deleted

## 2020-11-03 MED ORDER — POTASSIUM CHLORIDE ER 20 MEQ PO TBCR: 20.0000 meq | EXTENDED_RELEASE_TABLET | Freq: Two times a day (BID) | ORAL | 0 refills | Status: DC

## 2020-11-18 ENCOUNTER — Ambulatory Visit: Payer: No Typology Code available for payment source | Admitting: Obstetrics and Gynecology

## 2020-11-18 ENCOUNTER — Other Ambulatory Visit (HOSPITAL_COMMUNITY)
Admission: RE | Admit: 2020-11-18 | Discharge: 2020-11-18 | Disposition: A | Discharge status: Home / Self Care | Payer: No Typology Code available for payment source | Source: Ambulatory Visit | Attending: Obstetrics and Gynecology | Admitting: Obstetrics and Gynecology

## 2020-11-18 ENCOUNTER — Encounter: Payer: Self-pay | Admitting: Obstetrics and Gynecology

## 2020-11-18 ENCOUNTER — Other Ambulatory Visit: Payer: Self-pay

## 2020-11-18 VITALS — BP 133/85 | HR 85 | Ht 66.0 in | Wt 272.0 lb

## 2020-11-18 DIAGNOSIS — N898 Other specified noninflammatory disorders of vagina: Secondary | ICD-10-CM

## 2020-11-18 MED ORDER — FLUCONAZOLE 150 MG PO TABS: 150.0000 mg | ORAL_TABLET | ORAL | 0 refills | Status: DC

## 2020-11-18 MED ORDER — METRONIDAZOLE 500 MG PO TABS: 500.0000 mg | ORAL_TABLET | Freq: Two times a day (BID) | ORAL | 0 refills | Status: AC

## 2020-11-18 NOTE — Progress Notes (Signed)
   GYNECOLOGY OFFICE VISIT NOTE  History:   Judy Marshall is a 40 y.o. G0P0000 here today for vaginal irritation consisting of itching and burning that started yesterday. The last time this happened was in march 2021. She used to have symptoms more often but then she started a probiotic which has helped. She has not been sexually active since symptomatic.  She has not tried anything for the symptoms. Usually she gets a yeast infection after treatment if BV.  She denies any bleeding, pelvic pain or other concerns.   She only otherwise notes intermittent amenorrhea on her OCPs which has been happening more often with time. Her mom went through what they thought was menopause in her early 40s and then had her at 71. We discussed this sounds like more likely PCOS, but hard to say.   The following portions of the patient's history were reviewed and updated as appropriate: allergies, current medications, past family history, past medical history, past social history, past surgical history and problem list.   Health Maintenance:  Normal pap and negative HRHPV on 04/2020.   Review of Systems:  Pertinent items noted in HPI and remainder of comprehensive ROS otherwise negative.  Physical Exam:  BP 133/85   Pulse 85   Ht 5\' 6"  (1.676 m)   Wt 272 lb (123.4 kg)   BMI 43.90 kg/m  CONSTITUTIONAL: Well-developed, well-nourished female in no acute distress.  HEENT:  Normocephalic, atraumatic. External right and left ear normal. No scleral icterus.  NECK: Normal range of motion, supple, no masses noted on observation SKIN: No rash noted. Not diaphoretic. No erythema. No pallor. MUSCULOSKELETAL: Normal range of motion. No edema noted. NEUROLOGIC: Alert and oriented to person, place, and time. Normal muscle tone coordination. No cranial nerve deficit noted. PSYCHIATRIC: Normal mood and affect. Normal behavior. Normal judgment and thought content.  CARDIOVASCULAR: Normal heart rate noted RESPIRATORY:  Effort and breath sounds normal, no problems with respiration noted ABDOMEN: No masses noted. No other overt distention noted.    PELVIC: Normal appearing external genitalia; normal urethral meatus; normal appearing vaginal mucosa and cervix.  Thin gray discharge with some small white clumping with in it - no odor noted.  Normal uterine size, no other palpable masses, no uterine or adnexal tenderness. Performed in the presence of a chaperone  Assessment and Plan:    1. Vaginal irritation - Will do empiric therapy but cultures collected as well. She declines GC/CT testing  - Cervicovaginal ancillary only( [place]) - fluconazole (DIFLUCAN) 150 MG tablet; Take 1 tablet (150 mg total) by mouth every 3 (three) days. For two doses  Dispense: 2 tablet; Refill: 0 - metroNIDAZOLE (FLAGYL) 500 MG tablet; Take 1 tablet (500 mg total) by mouth 2 (two) times daily for 14 days.  Dispense: 28 tablet; Refill: 0  Routine preventative health maintenance measures emphasized. Please refer to After Visit Summary for other counseling recommendations.   Return if symptoms worsen or fail to improve and for annual exam.     , MD, FACOG Obstetrician & Gynecologist, [facility] for [facility], [facility]

## 2020-11-19 LAB — CERVICOVAGINAL ANCILLARY ONLY
Bacterial Vaginitis (gardnerella): NEGATIVE
Candida Glabrata: NEGATIVE
Candida Vaginitis: POSITIVE — AB
Comment: NEGATIVE
Comment: NEGATIVE
Comment: NEGATIVE

## 2020-11-24 ENCOUNTER — Other Ambulatory Visit: Payer: Self-pay | Admitting: Obstetrics and Gynecology

## 2020-11-24 DIAGNOSIS — N898 Other specified noninflammatory disorders of vagina: Secondary | ICD-10-CM

## 2020-11-24 MED ORDER — FLUCONAZOLE 150 MG PO TABS: 150.0000 mg | ORAL_TABLET | ORAL | 0 refills | Status: DC

## 2020-11-25 ENCOUNTER — Telehealth (INDEPENDENT_AMBULATORY_CARE_PROVIDER_SITE_OTHER): Payer: No Typology Code available for payment source | Admitting: Family Medicine

## 2020-11-25 ENCOUNTER — Encounter: Payer: Self-pay | Admitting: Family Medicine

## 2020-11-25 DIAGNOSIS — U071 COVID-19: Secondary | ICD-10-CM | POA: Diagnosis not present

## 2020-11-25 NOTE — Progress Notes (Signed)
Virtual Video Visit via MyChart Note  I connected with  Dorena Cookey on 11/25/20 at  3:00 PM EDT by the video enabled telemedicine application for MyChart, and verified that I am speaking with the correct person using two identifiers.   I introduced myself as a Publishing rights manager with the practice. We discussed the limitations of evaluation and management by telemedicine and the availability of in person appointments. The patient expressed understanding and agreed to proceed.  Participating parties in this visit include: The patient and the nurse practitioner listed.  The patient is: At home I am: In the office - Primary Care [facility] Sharper  Subjective:    CC:  Chief Complaint  Patient presents with   Covid Positive    HPI: Judy Marshall is a 40 y.o. year old female presenting today via MyChart today for COVID +.  Patient had been having allergy type symptoms since Tuesday (husband had COVID at the time, but she tested negative initially). Symptoms progressed to more cold/sinus-type symptoms and she tested positive this morning. She reports nasal congestion, rhinorrhea, PND, tickle in throat, dry cough, scratchy throat, itchy eyes and ear, occasional sinus pressure and low grade fevers. She denies any ear pain, chest pain, shortness of breath, nausea, vomiting, diarrhea, loss of taste/smell, fatigue. She is vaccinated. She has been getting some relief with DaQuil, NyQuil, and Mucinex.      Past medical history, Surgical history, Family history not pertinant except as noted below, Social history, Allergies, and medications have been entered into the medical record, reviewed, and corrections made.   Review of Systems:  All review of systems negative except what is listed in the HPI   Objective:    General:  Speaking clearly in complete sentences. Absent shortness of breath noted.   Alert and oriented x3.   Normal judgment.  Absent acute distress.   Impression and  Recommendations:    1. COVID-19 Patient doing very well overall. She agrees that she is managing fine and does not necessarily need antiviral therapy. Patient agreeable to continue supportive measures including rest, hydration, humidifier use, warm compresses, warm liquids with honey and lemon, chicken broth, OTC cough/cold/analgesics. States she is going to try an OTC cough syrup, but will reach out to [place] tomorrow if she feels like she needs something stronger - last night was a good night per patient. Patient aware of signs/symptoms requiring further/urgent evaluation.   Follow-up if symptoms worsen or fail to improve.    I discussed the assessment and treatment plan with the patient. The patient was provided an opportunity to ask questions and all were answered. The patient agreed with the plan and demonstrated an understanding of the instructions.   The patient was advised to call back or seek an in-person evaluation if the symptoms worsen or if the condition fails to improve as anticipated.  I spent 20 minutes dedicated to the care of this patient on the date of this encounter to include pre-visit chart review of prior notes and results, face-to-face time with the patient, and post-visit ordering of testing as indicated.   Clayborne Dana, NP

## 2020-11-25 NOTE — Patient Instructions (Signed)

## 2020-12-30 ENCOUNTER — Encounter: Payer: Self-pay | Admitting: Family Medicine

## 2021-01-18 ENCOUNTER — Encounter: Payer: Self-pay | Admitting: Family Medicine

## 2021-01-18 DIAGNOSIS — Z0000 Encounter for general adult medical examination without abnormal findings: Secondary | ICD-10-CM

## 2021-01-18 DIAGNOSIS — I10 Essential (primary) hypertension: Secondary | ICD-10-CM

## 2021-01-18 DIAGNOSIS — E786 Lipoprotein deficiency: Secondary | ICD-10-CM

## 2021-01-18 DIAGNOSIS — R7301 Impaired fasting glucose: Secondary | ICD-10-CM

## 2021-01-20 LAB — COMPLETE METABOLIC PANEL WITH GFR
AG Ratio: 1.2 (calc) (ref 1.0–2.5)
ALT: 12 U/L (ref 6–29)
AST: 16 U/L (ref 10–30)
Albumin: 3.8 g/dL (ref 3.6–5.1)
Alkaline phosphatase (APISO): 68 U/L (ref 31–125)
BUN: 15 mg/dL (ref 7–25)
CO2: 26 mmol/L (ref 20–32)
Calcium: 8.7 mg/dL (ref 8.6–10.2)
Chloride: 103 mmol/L (ref 98–110)
Creat: 0.8 mg/dL (ref 0.50–0.97)
Globulin: 3.1 g/dL (calc) (ref 1.9–3.7)
Glucose, Bld: 93 mg/dL (ref 65–99)
Potassium: 3.9 mmol/L (ref 3.5–5.3)
Sodium: 139 mmol/L (ref 135–146)
Total Bilirubin: 0.3 mg/dL (ref 0.2–1.2)
Total Protein: 6.9 g/dL (ref 6.1–8.1)
eGFR: 96 mL/min/{1.73_m2} (ref >=60)

## 2021-01-20 LAB — CBC WITH DIFFERENTIAL/PLATELET
Absolute Monocytes: 271 cells/uL (ref 200–950)
Basophils Absolute: 19 cells/uL (ref 0–200)
Basophils Relative: 0.3 %
Eosinophils Absolute: 32 cells/uL (ref 15–500)
Eosinophils Relative: 0.5 %
HCT: 36.7 % (ref 35.0–45.0)
Hemoglobin: 12 g/dL (ref 11.7–15.5)
Lymphs Abs: 2016 cells/uL (ref 850–3900)
MCH: 28.5 pg (ref 27.0–33.0)
MCHC: 32.7 g/dL (ref 32.0–36.0)
MCV: 87.2 fL (ref 80.0–100.0)
MPV: 10.4 fL (ref 7.5–12.5)
Monocytes Relative: 4.3 %
Neutro Abs: 3963 cells/uL (ref 1500–7800)
Neutrophils Relative %: 62.9 %
Platelets: 326 10*3/uL (ref 140–400)
RBC: 4.21 10*6/uL (ref 3.80–5.10)
RDW: 13.8 % (ref 11.0–15.0)
Total Lymphocyte: 32 %
WBC: 6.3 10*3/uL (ref 3.8–10.8)

## 2021-01-20 LAB — LIPID PANEL W/REFLEX DIRECT LDL
Cholesterol: 146 mg/dL (ref <200)
HDL: 45 mg/dL — ABNORMAL LOW (ref >=50)
LDL Cholesterol (Calc): 80 mg/dL (calc)
Non-HDL Cholesterol (Calc): 101 mg/dL (calc) (ref <130)
Total CHOL/HDL Ratio: 3.2 (calc) (ref <5.0)
Triglycerides: 109 mg/dL (ref <150)

## 2021-01-20 LAB — HEMOGLOBIN A1C
Hgb A1c MFr Bld: 5.8 % of total Hgb — ABNORMAL HIGH (ref <5.7)
Mean Plasma Glucose: 120 mg/dL
eAG (mmol/L): 6.6 mmol/L

## 2021-01-21 NOTE — Progress Notes (Signed)
Hi Judy Marshall,  Your cholesterol overall looks good. The HDL, good cholesterol, is on the low end. Regular exercise will improve that number. Your blood count is OK and metabolic panel looks OK. A1C looks is stable.  Still I the pre-diabetes range. Continue to work on [employer] and exercise.

## 2021-02-01 ENCOUNTER — Other Ambulatory Visit: Payer: Self-pay | Admitting: Family Medicine

## 2021-02-11 ENCOUNTER — Other Ambulatory Visit: Payer: Self-pay

## 2021-02-11 ENCOUNTER — Encounter: Payer: Self-pay | Admitting: Family Medicine

## 2021-02-11 ENCOUNTER — Ambulatory Visit (INDEPENDENT_AMBULATORY_CARE_PROVIDER_SITE_OTHER): Payer: No Typology Code available for payment source | Admitting: Family Medicine

## 2021-02-11 VITALS — BP 132/64 | HR 85 | Ht 66.0 in | Wt 280.0 lb

## 2021-02-11 DIAGNOSIS — R7301 Impaired fasting glucose: Secondary | ICD-10-CM

## 2021-02-11 DIAGNOSIS — R269 Unspecified abnormalities of gait and mobility: Secondary | ICD-10-CM | POA: Diagnosis not present

## 2021-02-11 DIAGNOSIS — I10 Essential (primary) hypertension: Secondary | ICD-10-CM | POA: Diagnosis not present

## 2021-02-11 MED ORDER — OLMESARTAN MEDOXOMIL 5 MG PO TABS: 5.0000 mg | ORAL_TABLET | Freq: Every day | ORAL | 1 refills | Status: DC

## 2021-02-11 NOTE — Progress Notes (Signed)
Established Patient Office Visit  Subjective:  Patient ID: Judy Marshall, female    DOB: 1980/08/19  Age: 40 y.o. MRN: [id number]  CC:  Chief Complaint  Patient presents with   Hypertension    HPI Judy Marshall presents for   HTN - stopped her lisinopril about 2 weeks or so ago because felt she like it was make her gait feel "off".  She said she did not feel lightheaded or like the room was spinning she just felt like it was difficult to walk in a straight line.  She was a little bit scared to even go to the gym.  Since being off of the medication she feels like her symptoms have gotten significantly better.  Though she still has had a couple of times where she fell off a little bit.  She is on half a tab of chlrothalidone.  No recent chest pain or shortness of breath.    Interestingly,  a few weeks ago she has some persistent ear ringing that lasted about 2 days, in her right ear.  She was not having any pain at that time she just kept trying to get it to pop.  It did eventually go away.  She did follow-up with a rheumatologist at [facility] in regards to her joint pain and swelling that occurred after her COVID-vaccine.  Most of her symptoms have completely resolved and he felt like she does not have any residual autoimmune disease and has been released from his care.  That he did offer to see her back in a year if she would like or if something changes.  Concern about skin tags.  She has 2 under her right breast, 1 in the left axilla, and one on her right breast.  Past Medical History:  Diagnosis Date   Broken bones 1989   Left elbow   Hypertension    IBS (irritable bowel syndrome)    Pericarditis 2009   Pericarditis    Vaginal Pap smear, abnormal     Past Surgical History:  Procedure Laterality Date   COLPOSCOPY     Left arm fracture  1989   TONSILLECTOMY  2004    Family History  Problem Relation Age of Onset   Hypertension Mother    Stomach cancer Maternal  Grandmother    Non-Hodgkin's lymphoma Maternal Grandfather    Stroke Paternal Grandfather     Social History   Socioeconomic History   Marital status: Married    Spouse name: Virgie Dad   Number of children: 0   Years of education: Not on file   Highest education level: Not on file  Occupational History   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   Social Determinants of Health   Financial Resource Strain: Not on file  Food Insecurity: Not on file  Transportation Needs: Not on file  Physical Activity: Not on file  Stress: Not on file  Social Connections: Not on file  Intimate Partner  Violence: Not on file    Outpatient Medications Prior to Visit  Medication Sig Dispense Refill   chlorthalidone (HYGROTON) 25 MG tablet Take 0.5 tablets (12.5 mg total) by mouth daily. 45 tablet 3   levonorgestrel-ethinyl estradiol (VIENVA) 0.1-20 MG-MCG tablet Take 1 tablet by mouth daily. 28 tablet 12   omeprazole (PRILOSEC) 20 MG capsule Take by mouth.     Potassium Chloride ER 20 MEQ TBCR TAKE 1 TABLET BY MOUTH IN THE MORNING AND AT BEDTIME 180 tablet 0   Probiotic Product (PROBIOTIC-10 PO) Take by mouth.     fluconazole (DIFLUCAN) 150 MG tablet Take 1 tablet (150 mg total) by mouth every 3 (three) days. For two doses 2 tablet 0   lisinopril (ZESTRIL) 5 MG tablet Take 5 mg by mouth daily.     No facility-administered medications prior to visit.    Allergies  Allergen Reactions   Lisinopril Other (See Comments)    Dizziness, pt stated it made her feel off balance     ROS Review of Systems    Objective:    Physical Exam Constitutional:      Appearance: Normal appearance. She is well-developed.  HENT:     Head: Normocephalic and atraumatic.     Right Ear: Tympanic membrane and ear canal normal.     Left Ear: Tympanic membrane and ear canal normal.  Cardiovascular:     Rate and Rhythm: Normal rate and regular rhythm.     Heart sounds: Normal heart sounds.  Pulmonary:     Effort: Pulmonary effort is normal.     Breath sounds: Normal breath sounds.  Skin:    General: Skin is warm and dry.  Neurological:     Mental Status: She is alert and oriented to person, place, and time.  Psychiatric:        Behavior: Behavior normal.    BP 132/64   Pulse 85   Ht $R'5\' 6"'VS$  (1.676 m)   Wt 280 lb (127 kg)   SpO2 100%   BMI 45.19 kg/m  Wt Readings from Last 3 Encounters:  02/11/21 280 lb (127 kg)  11/18/20 272 lb (123.4 kg)  06/04/20 271 lb (122.9 kg)     Health Maintenance Due  Topic Date Due   Hepatitis C Screening  Never done   TETANUS/TDAP  12/30/2015   COVID-19 Vaccine (3 - Booster for Pfizer series) 01/30/2020    There are no preventive care reminders to display for this patient.  Lab Results  Component Value Date   TSH 2.61 12/30/2019   Lab Results  Component Value Date   WBC 6.3 01/19/2021   HGB 12.0 01/19/2021   HCT 36.7 01/19/2021   MCV 87.2 01/19/2021   PLT 326 01/19/2021   Lab Results  Component Value Date   NA 139 01/19/2021   K 3.9 01/19/2021   CO2 26 01/19/2021   GLUCOSE 93 01/19/2021   BUN 15 01/19/2021   CREATININE 0.80 01/19/2021   BILITOT 0.3 01/19/2021   ALKPHOS 69 07/25/2012   AST 16 01/19/2021   ALT 12 01/19/2021   PROT 6.9 01/19/2021   ALBUMIN 3.9 07/25/2012   CALCIUM 8.7 01/19/2021   EGFR 96 01/19/2021   Lab Results  Component Value Date   CHOL 146 01/19/2021   Lab Results  Component Value Date   HDL 45 (L) 01/19/2021   Lab Results  Component Value Date   LDLCALC 80 01/19/2021   Lab  Results  Component Value Date   TRIG 109  01/19/2021   Lab Results  Component Value Date   CHOLHDL 3.2 01/19/2021   Lab Results  Component Value Date   HGBA1C 5.8 (H) 01/19/2021      Assessment & Plan:   Problem List Items Addressed This Visit       Cardiovascular and Mediastinum   Hypertension goal BP (blood pressure) < 130/80 - Primary    Discontinue lisinopril because of side effects.  She has been gradually getting better off of the medication she is now been off of it for 2 weeks.  We did discuss the possibility that it may or may not be the medication since she still having some occasional effects.  Either way we will go ahead and change her to an ARB instead.  Is like to stick with an ACE or an ARB if possible because it helps balances out her low potassium levels.  Plan to have her follow-up in about a month to recheck her blood pressure if she is doing well.  She does have a home blood pressure cuff also so encouraged her to check that at home 2.      Relevant Medications   olmesartan (BENICAR) 5 MG tablet     Endocrine   IFG (impaired fasting glucose)    Recent A1C in October was 5.8.  Continue work on [employer] regular exercise.  It sounds like she is planning on joining the gym which would be great.      Other Visit Diagnoses     Gait disturbance           Skin tags-discussed that we could schedule separate appointment for removal.  I think she would do well.  She can schedule at her convenience or see dermatology which ever she prefers.  Gait disturbance-certainly could be a medication side effect.  But also consider other causes such as fluid in the ears or sinus pressure etc.  She is feeling better since she came off of the medication.  So work on a switch medicines just to see if that makes a difference.  If symptoms return please let us know.  Meds ordered this encounter  Medications   olmesartan (BENICAR) 5 MG tablet    Sig: Take 1 tablet (5 mg  total) by mouth daily.    Dispense:  30 tablet    Refill:  1    Follow-up: Return in about 4 weeks (around 03/11/2021) for Nurse visit for BP.    Beatrice Lecher, MD

## 2021-02-11 NOTE — Assessment & Plan Note (Signed)
Recent A1C in October was 5.8.  Continue work on [employer] regular exercise.  It sounds like she is planning on joining the gym which would be great.

## 2021-02-11 NOTE — Assessment & Plan Note (Signed)
Discontinue lisinopril because of side effects.  She has been gradually getting better off of the medication she is now been off of it for 2 weeks.  We did discuss the possibility that it may or may not be the medication since she still having some occasional effects.  Either way we will go ahead and change her to an ARB instead.  Is like to stick with an ACE or an ARB if possible because it helps balances out her low potassium levels.  Plan to have her follow-up in about a month to recheck her blood pressure if she is doing well.  She does have a home blood pressure cuff also so encouraged her to check that at home 2.

## 2021-02-15 ENCOUNTER — Ambulatory Visit: Payer: No Typology Code available for payment source | Admitting: Family Medicine

## 2021-03-01 ENCOUNTER — Ambulatory Visit: Payer: No Typology Code available for payment source | Admitting: Family Medicine

## 2021-03-11 ENCOUNTER — Ambulatory Visit: Payer: No Typology Code available for payment source

## 2021-03-14 ENCOUNTER — Ambulatory Visit (INDEPENDENT_AMBULATORY_CARE_PROVIDER_SITE_OTHER): Payer: No Typology Code available for payment source | Admitting: Family Medicine

## 2021-03-14 ENCOUNTER — Other Ambulatory Visit: Payer: Self-pay

## 2021-03-14 ENCOUNTER — Encounter: Payer: Self-pay | Admitting: Family Medicine

## 2021-03-14 VITALS — BP 142/80 | HR 68 | Temp 98.0°F | Resp 18 | Ht 66.0 in | Wt 279.0 lb

## 2021-03-14 DIAGNOSIS — L918 Other hypertrophic disorders of the skin: Secondary | ICD-10-CM

## 2021-03-14 NOTE — Patient Instructions (Signed)
Tomorrow morning okay to remove the Band-Aids and take a shower.  Do not scrub at the areas but you can certainly use your fingertips to gently clean with soap and water and rinse.  Pat dry and apply a small dab of Vaseline or Aquaphor. To you to apply Vaseline for about 5 to 7 days.  If any concerns about how its healing please let us know.

## 2021-03-14 NOTE — Progress Notes (Signed)
Acute Office Visit  Subjective:    Patient ID: Judy Marshall, female    DOB: 1980-11-09, 40 y.o.   MRN: [id number]  Chief Complaint  Patient presents with   Skin tag removal    6 skin tags on abdomen     HPI Patient is in today for Skin Tag removal.  She is here to have several removed most of them tend to occur underneath the breast area and in the axilla and she does have on her right side where her pants rubs.  Do get irritated.  Past Medical History:  Diagnosis Date   Broken bones 1989   Left elbow   Hypertension    IBS (irritable bowel syndrome)    Pericarditis 2009   Pericarditis    Vaginal Pap smear, abnormal     Past Surgical History:  Procedure Laterality Date   COLPOSCOPY     Left arm fracture  1989   TONSILLECTOMY  2004    Family History  Problem Relation Age of Onset   Hypertension Mother    Stomach cancer Maternal Grandmother    Non-Hodgkin's lymphoma Maternal Grandfather    Stroke Paternal Grandfather     Social History   Socioeconomic History   Marital status: Married    Spouse name: Virgie Dad   Number of children: 0   Years of education: Not on file   Highest education level: Not on file  Occupational History   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   Social Determinants of Health   Financial Resource Strain: Not on file  Food Insecurity: Not on file  Transportation Needs: Not on file  Physical Activity: Not on file  Stress: Not on file  Social Connections: Not on file  Intimate Partner Violence: Not on file     Outpatient Medications Prior to Visit  Medication Sig Dispense Refill   chlorthalidone (HYGROTON) 25 MG tablet Take 0.5 tablets (12.5 mg total) by mouth daily. 45 tablet 3   levonorgestrel-ethinyl estradiol (VIENVA) 0.1-20 MG-MCG tablet Take 1 tablet by mouth daily. 28 tablet 12   olmesartan (BENICAR) 5 MG tablet Take 1 tablet (5 mg total) by mouth daily. 30 tablet 1   omeprazole (PRILOSEC) 20 MG capsule Take by mouth.     Potassium Chloride ER 20 MEQ TBCR TAKE 1 TABLET BY MOUTH IN THE MORNING AND AT BEDTIME 180 tablet 0   Probiotic Product (PROBIOTIC-10 PO) Take by mouth.     No facility-administered medications prior to visit.    Allergies  Allergen Reactions   Lisinopril Other (See Comments)    Dizziness, pt stated it made her feel off balance    Review of Systems     Objective:    Physical Exam Vitals reviewed.  Constitutional:      Appearance: She is well-developed.  HENT:     Head: Normocephalic and atraumatic.  Eyes:     Conjunctiva/sclera: Conjunctivae normal.  Cardiovascular:  Rate and Rhythm: Normal rate.  Pulmonary:     Effort: Pulmonary effort is normal.  Skin:    General: Skin is dry.     Coloration: Skin is not pale.     Comments: 3 skin tags underneath the right breast, 1 on the right breast.  1 under the right axilla, 1 under the left axilla, and 1 on her right side where her pants rub.  Above the hip.  Neurological:     Mental Status: She is alert and oriented to person, place, and time.  Psychiatric:        Behavior: Behavior normal.    BP (!) 142/80    Pulse 68    Temp 98 F (36.7 C)    Resp 18    Ht 5' 6" (1.676 m)    Wt 279 lb (126.6 kg)    SpO2 100%    BMI 45.03 kg/m  Wt Readings from Last 3 Encounters:  03/14/21 279 lb (126.6 kg)  02/11/21 280 lb (127 kg)  11/18/20 272 lb (123.4 kg)    Health Maintenance Due  Topic Date Due   Pneumococcal Vaccine 31-41 Years old (1 - PCV) Never done   Hepatitis C Screening  Never done     There are no preventive care reminders to display for this patient.   Lab Results  Component Value Date   TSH 2.61 12/30/2019   Lab Results  Component Value Date   WBC 6.3 01/19/2021   HGB 12.0 01/19/2021   HCT 36.7 01/19/2021   MCV 87.2 01/19/2021   PLT 326 01/19/2021   Lab Results  Component Value Date   NA 139 01/19/2021   K 3.9 01/19/2021   CO2 26 01/19/2021   GLUCOSE 93 01/19/2021   BUN 15 01/19/2021   CREATININE 0.80 01/19/2021   BILITOT 0.3 01/19/2021   ALKPHOS 69 07/25/2012   AST 16 01/19/2021   ALT 12 01/19/2021   PROT 6.9 01/19/2021   ALBUMIN 3.9 07/25/2012   CALCIUM 8.7 01/19/2021   EGFR 96 01/19/2021   Lab Results  Component Value Date   CHOL 146 01/19/2021   Lab Results  Component Value Date   HDL 45 (L) 01/19/2021   Lab Results  Component Value Date   LDLCALC 80 01/19/2021   Lab Results  Component Value Date   TRIG 109 01/19/2021   Lab Results  Component Value Date   CHOLHDL 3.2 01/19/2021   Lab Results  Component Value Date   HGBA1C 5.8 (H) 01/19/2021       Assessment & Plan:   Problem List Items Addressed This Visit   None Visit Diagnoses     Skin tag    -  Primary      Skin Tag Removal Procedure Note Diagnosis: inflamed skin tags Location:  under right breast, both axillar and right side at her pants line Informed Consent: Discussed risks (permanent scarring, infection, pain, bleeding, bruising, redness, and recurrence of the lesion) and benefits of the procedure, as well as the alternatives. She is aware that skin tags are benign lesions, and their removal is often not considered medically necessary. Informed consent was obtained. Preparation: The area was prepared in a standard fashion. Anesthesia: not required Procedure Details: Iris scissors were used to perform sharp removal. Aluminum chloride was applied for hemostasis. Ointment and bandage were applied where needed. The patient tolerated the procedure well. Total  number of lesions treated: 7 Plan: The patient was instructed on post-op care. Recommend OTC analgesia  as needed for pain.   No orders of the defined types were placed in this encounter.    Beatrice Lecher, MD

## 2021-03-29 ENCOUNTER — Other Ambulatory Visit: Payer: Self-pay | Admitting: Family Medicine

## 2021-03-29 DIAGNOSIS — I10 Essential (primary) hypertension: Secondary | ICD-10-CM

## 2021-05-19 ENCOUNTER — Ambulatory Visit: Payer: No Typology Code available for payment source | Admitting: Obstetrics and Gynecology

## 2021-06-01 NOTE — Progress Notes (Signed)
? ?ANNUAL EXAM ?Patient name: Judy Marshall MRN [id number]  Date of birth: October 14, 1980 ?Chief Complaint:   ?Gynecologic Exam ? ?History of Present Illness:   ?Judy Marshall is a 41 y.o. G0P0000 Caucasian female being seen today for a routine annual exam.  ? ?Current complaints: Cyclic breast tenderness. Has been amenorrheic on OCPs but now having periods during placebo weeks. Wondering about safety for her to continue OCPs given age, etc.  ? ?Patient's last menstrual period was 05/28/2021 (exact date). ? ? ?The pregnancy intention screening data noted above was reviewed. Potential methods of contraception were discussed. The patient elected to proceed with No data recorded.  ? ?Last pap 04/2020. Results were: NILM w/ HRHPV negative. H/O abnormal pap: yes ?Last mammogram: Not yet had.  Family h/o breast cancer: no ?Last colonoscopy: Not yet had. Family h/o colorectal cancer: no ? ?Depression screen [facility] 2/9 03/14/2021 02/11/2021 12/30/2019 11/18/2018  ?Decreased Interest 0 0 0 0  ?Down, Depressed, Hopeless 0 0 0 0  ?PHQ - 2 Score 0 0 0 0  ? ? No flowsheet data found. ? ? ?Review of Systems:   ?Pertinent items are noted in HPI ?Denies any headaches, blurred vision, fatigue, shortness of breath, chest pain, abdominal pain, abnormal vaginal discharge/itching/odor/irritation, problems with periods, bowel movements, urination, or intercourse unless otherwise stated above.  ?Pertinent History Reviewed:  ?Reviewed past medical,surgical, social and family history.  ?Reviewed problem list, medications and allergies. ?Physical Assessment:  ? ?Vitals:  ? 06/02/21 1430  ?BP: (!) 150/93  ?Pulse: 88  ?Weight: 276 lb 0.1 oz (125.2 kg)  ?Body mass index is 44.55 kg/m?. ?  ?Physical Examination:  ?General appearance - well appearing, and in no distress ?Mental status - alert, oriented to person, place, and time ?Psych:  She has a normal mood and affect ?Skin - warm and dry, normal color, no suspicious lesions noted ?Chest -  effort normal, all lung fields clear to auscultation bilaterally ?Heart - normal rate and regular rhythm ?Neck:  midline trachea, no thyromegaly or nodules ?Breasts - breasts appear normal, no suspicious masses, no skin or nipple changes or axillary nodes ?Abdomen - soft, nontender, nondistended, no masses or organomegaly ?Pelvic -  Patient declines as asymptomatic ?Extremities:  No swelling or varicosities noted ? ?Chaperone present for exam ? ?No results found for this or any previous visit (from the past 24 hour(s)).  ?Assessment & Plan:  ?1) Well-Woman Exam ?- Cervical cancer screening: Discussed guidelines. Pap with HPV wnl 04/2020 ?- Gardasil:  Did not discuss ?- STD Testing: not indicated ?- Birth Control: Discussed options and their risks, benefits and common side effects; discussed VTE with estrogen containing options. Desires: OCPs but would like to try POP due to risk of MI with HTN (even if well controlled at home). Discussed POP and other options in a tiered fashion reviewed risks and side effects of each.  ?- Breast Health: Encouraged self breast awareness/SBE. Teaching provided. Discussed limits of clinical breast exam for detecting breast cancer. Rx given for MXR ?- F/U 12 months and prn ? ?2) Breast tenderness ?- Cyclic but if becomes persistent or palpable lump would recommend imaging sooner, but otherwise may proceed with mammogram as scheduled.  ? ?Labs/procedures today: MXR ordered.  ? ?Mammogram: @ 40yo, or sooner if problems ?Colonoscopy: @ 40yo, or sooner if problems ? ?Orders Placed This Encounter  ?Procedures  ? MM 3D SCREEN BREAST BILATERAL  ? ? ?Meds:  ?Meds ordered this encounter  ?Medications  ? Drospirenone (  SLYND) 4 MG TABS  ?  Sig: Take 1 tablet by mouth daily.  ?  Dispense:  28 tablet  ?  Refill:  12  ?- If slynd too expensive or not covered by insurance, would do Norethindrone 0.35 mg daily.  ? ?Follow-up: No follow-ups on file. ? ?Milas Hock, MD ?06/02/2021 ?3:46 PM ?

## 2021-06-02 ENCOUNTER — Encounter: Payer: Self-pay | Admitting: Obstetrics and Gynecology

## 2021-06-02 ENCOUNTER — Ambulatory Visit (INDEPENDENT_AMBULATORY_CARE_PROVIDER_SITE_OTHER): Payer: No Typology Code available for payment source | Admitting: Obstetrics and Gynecology

## 2021-06-02 ENCOUNTER — Other Ambulatory Visit: Payer: Self-pay

## 2021-06-02 VITALS — BP 150/93 | HR 88 | Wt 276.0 lb

## 2021-06-02 DIAGNOSIS — Z01419 Encounter for gynecological examination (general) (routine) without abnormal findings: Secondary | ICD-10-CM

## 2021-06-02 DIAGNOSIS — N644 Mastodynia: Secondary | ICD-10-CM | POA: Diagnosis not present

## 2021-06-02 DIAGNOSIS — Z3009 Encounter for other general counseling and advice on contraception: Secondary | ICD-10-CM | POA: Diagnosis not present

## 2021-06-02 MED ORDER — SLYND 4 MG PO TABS: 1.0000 | ORAL_TABLET | Freq: Every day | ORAL | 12 refills | Status: DC

## 2021-06-03 ENCOUNTER — Other Ambulatory Visit: Payer: Self-pay

## 2021-06-03 DIAGNOSIS — Z789 Other specified health status: Secondary | ICD-10-CM

## 2021-06-03 MED ORDER — NORETHINDRONE 0.35 MG PO TABS
1.0000 | ORAL_TABLET | Freq: Every day | ORAL | 11 refills | Status: DC
Start: 2021-06-03 — End: 2021-07-05

## 2021-06-03 NOTE — Progress Notes (Signed)
Norethindrone 0.35 sent per Dr Para March ?

## 2021-06-15 ENCOUNTER — Other Ambulatory Visit: Payer: Self-pay | Admitting: Family Medicine

## 2021-07-04 ENCOUNTER — Encounter: Payer: Self-pay | Admitting: Obstetrics and Gynecology

## 2021-07-05 ENCOUNTER — Telehealth: Payer: Self-pay | Admitting: *Deleted

## 2021-07-05 ENCOUNTER — Other Ambulatory Visit: Payer: Self-pay | Admitting: *Deleted

## 2021-07-05 ENCOUNTER — Telehealth: Payer: Self-pay | Admitting: Obstetrics and Gynecology

## 2021-07-05 MED ORDER — SLYND 4 MG PO TABS: 1.0000 | ORAL_TABLET | Freq: Every day | ORAL | 12 refills | Status: DC

## 2021-07-05 NOTE — Progress Notes (Signed)
RX for [employer] sent to [facility] in [place], [place].  They will check for a PA and if insurance denies then she can get it for 20.00 a month.  They will mail the RX to patient. ?

## 2021-07-05 NOTE — Telephone Encounter (Signed)
Patient is having side effects from [facility]. Would like to know what to do from this point. Does not want to give it the time that Dr. Damita Dunnings has asked her to give for side effects to possibly go away. Please contact patient at [phone]. ?

## 2021-07-05 NOTE — Telephone Encounter (Signed)
Spoke with Selena Batten -  ? ?She is having significant joint and back pain with the POP, Norethindrone. She is also having cramping. She is taking ibuprofen but is concerned by how much. She is going to try and take it for another couple weeks, but is concerned about waiting through the second pill pack.  ? ?We reviewed other options.  ? ?At this time she would not like Depo due to risk of weight gain and does not want Nexplanon due to risk of irregular bleeding possibility.  ? ?She would consider IUD but would prefer something not in her body.  ? ?Reviewed we can appeal Slynd with her insurance company and then I can also try to do phone conversation with the insurance company if they still deny it.  ? ?Milas Hock, MD ?Attending Obstetrician & Gynecologist, Faculty Practice ?Center for [employer], [facility] ? ? ?

## 2021-08-04 ENCOUNTER — Other Ambulatory Visit: Payer: Self-pay | Admitting: Family Medicine

## 2021-08-04 DIAGNOSIS — I10 Essential (primary) hypertension: Secondary | ICD-10-CM

## 2021-09-11 ENCOUNTER — Other Ambulatory Visit: Payer: Self-pay | Admitting: Family Medicine

## 2021-10-18 ENCOUNTER — Other Ambulatory Visit (HOSPITAL_BASED_OUTPATIENT_CLINIC_OR_DEPARTMENT_OTHER): Payer: Self-pay | Admitting: Family Medicine

## 2021-10-18 DIAGNOSIS — Z1231 Encounter for screening mammogram for malignant neoplasm of breast: Secondary | ICD-10-CM

## 2021-10-19 ENCOUNTER — Ambulatory Visit: Payer: No Typology Code available for payment source | Admitting: Family Medicine

## 2021-10-20 ENCOUNTER — Ambulatory Visit (INDEPENDENT_AMBULATORY_CARE_PROVIDER_SITE_OTHER): Payer: No Typology Code available for payment source

## 2021-10-20 DIAGNOSIS — Z1231 Encounter for screening mammogram for malignant neoplasm of breast: Secondary | ICD-10-CM

## 2021-10-24 ENCOUNTER — Other Ambulatory Visit: Payer: Self-pay | Admitting: Family Medicine

## 2021-10-24 ENCOUNTER — Telehealth: Payer: Self-pay | Admitting: Family Medicine

## 2021-10-24 ENCOUNTER — Encounter: Payer: Self-pay | Admitting: Family Medicine

## 2021-10-24 DIAGNOSIS — R928 Other abnormal and inconclusive findings on diagnostic imaging of breast: Secondary | ICD-10-CM

## 2021-10-24 NOTE — Telephone Encounter (Signed)
Pt called and states that her mammogram came back abnormal and she was told she will need further images but pt would like for Dr.Metheney to order this to be done in [place] somewhere ,even if its [address]

## 2021-10-24 NOTE — Progress Notes (Signed)
HI Selena Batten, your mammo shows a questionable area on right breast. The imaging department will be calling tot schedule additional films.

## 2021-11-01 ENCOUNTER — Encounter: Payer: Self-pay | Admitting: Family Medicine

## 2021-11-01 ENCOUNTER — Ambulatory Visit: Payer: No Typology Code available for payment source | Admitting: Family Medicine

## 2021-11-01 VITALS — BP 153/73 | HR 74 | Ht 66.0 in | Wt 274.0 lb

## 2021-11-01 DIAGNOSIS — R0982 Postnasal drip: Secondary | ICD-10-CM | POA: Insufficient documentation

## 2021-11-01 DIAGNOSIS — J302 Other seasonal allergic rhinitis: Secondary | ICD-10-CM

## 2021-11-01 DIAGNOSIS — I10 Essential (primary) hypertension: Secondary | ICD-10-CM

## 2021-11-01 MED ORDER — IPRATROPIUM BROMIDE 0.03 % NA SOLN: 2.0000 | Freq: Two times a day (BID) | NASAL | 3 refills | Status: DC

## 2021-11-01 MED ORDER — CHLORTHALIDONE 25 MG PO TABS: 25.0000 mg | ORAL_TABLET | Freq: Every day | ORAL | 1 refills | Status: DC

## 2021-11-01 NOTE — Telephone Encounter (Signed)
Orders Placed This Encounter  Procedures   US BREAST COMPLETE UNI LEFT INC AXILLA    Standing Status:   Future    Standing Expiration Date:   11/02/2022    Scheduling Instructions:     Please schedule at the breast center in [place].    Order Specific Question:   Reason for Exam (SYMPTOM  OR DIAGNOSIS REQUIRED)    Answer:   Abnormal screening mammo    Order Specific Question:   Preferred imaging location?    Answer:   External   MM Digital Diagnostic Unilat L    Standing Status:   Future    Standing Expiration Date:   11/02/2022    Scheduling Instructions:     Please schedule at the breast center in [place].    Order Specific Question:   Reason for Exam (SYMPTOM  OR DIAGNOSIS REQUIRED)    Answer:   Abnormal screening mammo    Order Specific Question:   Is the patient pregnant?    Answer:   No    Order Specific Question:   Preferred imaging location?    Answer:   External

## 2021-11-01 NOTE — Assessment & Plan Note (Signed)
We discussed options.  She is already on a PPI to reduce GERD symptoms.  She is also on an oral antihistamine, Allegra.  So recommend a trial of nasal ipratropium to help with drainage and runny nose.  If this is not helping somewhat after couple of weeks then please let me know and we could consider a trial of a combination nasal steroid/nasal antihistamine.

## 2021-11-01 NOTE — Assessment & Plan Note (Addendum)
BP is very high today.  It was also elevated 8 months ago.  She is not taking the ARB but is taking her chlorthalidone again and needs a refill.  She has been out of the medication.

## 2021-11-01 NOTE — Progress Notes (Signed)
Acute Office Visit  Subjective:     Patient ID: Judy Marshall, female    DOB: 30-Jul-1980, 41 y.o.   MRN: [id number]  Chief Complaint  Patient presents with   Allergic Rhinitis     HPI Patient is in today for allergic rhinitis.  She says it has been chronic for a long time but just feels like it is getting a little worse.  She mostly complains of postnasal drip and collection of mucus and drainage in her throat that causes her to clear her throat and cough to clear it constantly.  Always seems little worse in the mornings and usually at night.  She is not experiencing any facial pain or pressure or nosebleeds.  Occasionally will have some throat irritation but it is usually if she has been coughing a lot.  She does take Allegra for allergies and says that has helped with rashes and swelling that she was experiencing previously and she is on omeprazole for reflux.  ROS      Objective:    BP (!) 153/73   Pulse 74   Ht 5\' 6"  (1.676 m)   Wt 274 lb (124.3 kg)   SpO2 99%   BMI 44.22 kg/m    Physical Exam Constitutional:      Appearance: She is well-developed.  HENT:     Head: Normocephalic and atraumatic.     Right Ear: Tympanic membrane, ear canal and external ear normal.     Left Ear: Tympanic membrane, ear canal and external ear normal.     Nose: Nose normal.     Mouth/Throat:     Pharynx: Oropharynx is clear.  Eyes:     Conjunctiva/sclera: Conjunctivae normal.     Pupils: Pupils are equal, round, and reactive to light.  Neck:     Thyroid: No thyromegaly.  Cardiovascular:     Rate and Rhythm: Normal rate and regular rhythm.     Heart sounds: Normal heart sounds.  Pulmonary:     Effort: Pulmonary effort is normal.     Breath sounds: Normal breath sounds. No wheezing.  Musculoskeletal:     Cervical back: Neck supple.  Lymphadenopathy:     Cervical: No cervical adenopathy.  Skin:    General: Skin is warm and dry.  Neurological:     Mental Status: She is alert  and oriented to person, place, and time.     No results found for any visits on 11/01/21.      Assessment & Plan:   Problem List Items Addressed This Visit       Cardiovascular and Mediastinum   Hypertension goal BP (blood pressure) < 130/80    BP is very high today.  It was also elevated 8 months ago.  She is not taking the ARB but is taking her chlorthalidone again and needs a refill.  She has been out of the medication.      Relevant Medications   chlorthalidone (HYGROTON) 25 MG tablet     Other   Post-nasal drip    We discussed options.  She is already on a PPI to reduce GERD symptoms.  She is also on an oral antihistamine, Allegra.  So recommend a trial of nasal ipratropium to help with drainage and runny nose.  If this is not helping somewhat after couple of weeks then please let me know and we could consider a trial of a combination nasal steroid/nasal antihistamine.      Relevant Medications   ipratropium (ATROVENT)  0.03 % nasal spray   Other Visit Diagnoses     Seasonal allergic rhinitis, unspecified trigger    -  Primary   Relevant Medications   ipratropium (ATROVENT) 0.03 % nasal spray       Meds ordered this encounter  Medications   ipratropium (ATROVENT) 0.03 % nasal spray    Sig: Place 2 sprays into both nostrils every 12 (twelve) hours.    Dispense:  30 mL    Refill:  3   chlorthalidone (HYGROTON) 25 MG tablet    Sig: Take 1 tablet (25 mg total) by mouth daily.    Dispense:  90 tablet    Refill:  1    No follow-ups on file.  Nani Gasser, MD

## 2021-12-10 ENCOUNTER — Other Ambulatory Visit: Payer: Self-pay | Admitting: Family Medicine

## 2022-01-08 ENCOUNTER — Other Ambulatory Visit: Payer: Self-pay | Admitting: Family Medicine

## 2022-05-04 ENCOUNTER — Encounter: Payer: Self-pay | Admitting: Family Medicine

## 2022-05-04 ENCOUNTER — Ambulatory Visit: Payer: BC Managed Care – PPO | Admitting: Family Medicine

## 2022-05-04 VITALS — BP 138/78 | HR 70 | Temp 98.9°F | Ht 66.0 in | Wt 260.0 lb

## 2022-05-04 DIAGNOSIS — J019 Acute sinusitis, unspecified: Secondary | ICD-10-CM

## 2022-05-04 MED ORDER — AZITHROMYCIN 250 MG PO TABS: ORAL_TABLET | ORAL | 0 refills | Status: AC

## 2022-05-04 NOTE — Progress Notes (Signed)
   Acute Office Visit  Subjective:     Patient ID: Judy Marshall, female    DOB: January 08, 1981, 42 y.o.   MRN: [id number]  Chief Complaint  Patient presents with   Cough   URI    HPI Patient is in today for URIsxs.  She says it started about 8 days ago with a scratchy throat and then progressed into sinus congestion.  No fevers or chills.  But she says it is the worst sinus congestion and pressure she is actually ever had with an illness.  Last night she felt like it started draining into her chest and has started having a productive cough after 8 days of sinus symptoms.  She has been using over-the-counter Mucinex and ibuprofen.  She did go to the clinic at work on Monday and was given a prescription for Augmentin but they were too large for her to swallow and so she did not take them.  ROS      Objective:    BP 138/78   Pulse 70   Temp 98.9 F (37.2 C)   Ht 5\' 6"  (1.676 m)   Wt 260 lb (117.9 kg)   SpO2 100%   BMI 41.97 kg/m    Physical Exam Constitutional:      Appearance: She is well-developed.  HENT:     Head: Normocephalic and atraumatic.     Right Ear: Tympanic membrane, ear canal and external ear normal.     Left Ear: Tympanic membrane, ear canal and external ear normal.     Nose: Nose normal.     Mouth/Throat:     Pharynx: Oropharynx is clear.  Eyes:     Conjunctiva/sclera: Conjunctivae normal.     Pupils: Pupils are equal, round, and reactive to light.  Neck:     Thyroid: No thyromegaly.  Cardiovascular:     Rate and Rhythm: Normal rate and regular rhythm.     Heart sounds: Normal heart sounds.  Pulmonary:     Effort: Pulmonary effort is normal.     Breath sounds: Normal breath sounds. No wheezing.  Musculoskeletal:     Cervical back: Neck supple.  Lymphadenopathy:     Cervical: No cervical adenopathy.  Skin:    General: Skin is warm and dry.  Neurological:     Mental Status: She is alert and oriented to person, place, and time.     No  results found for any visits on 05/04/22.      Assessment & Plan:   Problem List Items Addressed This Visit   None Visit Diagnoses     Acute non-recurrent sinusitis, unspecified location    -  Primary   Relevant Medications   azithromycin (ZITHROMAX) 250 MG tablet      Acute sinusitis -will treat with azithromycin.  If not better in 1 week please let us know.  Still consider that this could be viral even though she has been sick for 8 days and seems to be getting worse.  Recommend symptomatic care.  Meds ordered this encounter  Medications   azithromycin (ZITHROMAX) 250 MG tablet    Sig: 2 Ttabs PO on Day 1, then one a day x 4 days.    Dispense:  6 tablet    Refill:  0    No follow-ups on file.  Beatrice Lecher, MD

## 2022-05-04 NOTE — Progress Notes (Deleted)
Virtual Visit via Video Note  I connected with Judy Marshall on 05/04/22 at  3:40 PM EST by a video enabled telemedicine application and verified that I am speaking with the correct person using two identifiers.   I discussed the limitations of evaluation and management by telemedicine and the availability of in person appointments. The patient expressed understanding and agreed to proceed.  Patient location: at home Provider location: in office  Subjective:    CC:   Chief Complaint  Patient presents with   Cough   URI    HPI:    Annual Wellness Visit     Patient: Judy Marshall, Female    DOB: 06-09-1980, 42 y.o.   MRN: [id number]  Subjective  Chief Complaint  Patient presents with   Cough   URI    Judy Marshall is a 42 y.o. female who presents today for her Annual Wellness Visit. She reports consuming a {diet types:17450} diet. {Exercise:19826} She generally feels {well/fairly well/poorly:18703}. She reports sleeping {well/fairly well/poorly:18703}. She {does/does not:200015} have additional problems to discuss today.   HPI  {VISON DENTAL STD PSA (Optional):27386}   {History (Optional):23778}  Medications: Outpatient Medications Prior to Visit  Medication Sig   chlorthalidone (HYGROTON) 25 MG tablet Take 1 tablet (25 mg total) by mouth daily.   ipratropium (ATROVENT) 0.03 % nasal spray Place 2 sprays into both nostrils every 12 (twelve) hours.   norethindrone-ethinyl estradiol 1/35 (ORTHO-NOVUM) tablet Take 1 tablet by mouth daily.   olmesartan (BENICAR) 5 MG tablet Take 1 tablet (5 mg total) by mouth daily.   omeprazole (PRILOSEC) 20 MG capsule Take by mouth.   Potassium Chloride ER 20 MEQ TBCR TAKE 1 TABLET BY MOUTH IN THE MORNING AND AT BEDTIME   Probiotic Product (PROBIOTIC-10 PO) Take by mouth.   No facility-administered medications prior to visit.    Allergies  Allergen Reactions   Lisinopril Other (See Comments)    Dizziness, pt  stated it made her feel off balance    Patient Care Team: Hali Marry, MD as PCP - General  ROS      Objective  BP 138/78   Pulse 70   Temp 98.9 F (37.2 C)   Ht 5\' 6"  (1.676 m)   Wt 260 lb (117.9 kg)   SpO2 100%   BMI 41.97 kg/m  {Vitals History (Optional):23777}  Physical Exam Constitutional:      Appearance: She is well-developed.  HENT:     Head: Normocephalic and atraumatic.     Right Ear: External ear normal.     Left Ear: External ear normal.     Nose: Nose normal.     Mouth/Throat:     Pharynx: Oropharynx is clear.  Eyes:     Conjunctiva/sclera: Conjunctivae normal.     Pupils: Pupils are equal, round, and reactive to light.  Neck:     Thyroid: No thyromegaly.  Cardiovascular:     Rate and Rhythm: Normal rate and regular rhythm.     Heart sounds: Normal heart sounds.  Pulmonary:     Effort: Pulmonary effort is normal.     Breath sounds: Normal breath sounds. No wheezing.  Musculoskeletal:     Cervical back: Neck supple.  Lymphadenopathy:     Cervical: No cervical adenopathy.  Skin:    General: Skin is warm and dry.  Neurological:     Mental Status: She is alert and oriented to person, place, and time.  Most recent functional status assessment:     No data to display         Most recent fall risk assessment:    05/04/2022    3:51 PM  [place] in the past year? 0  Number falls in past yr: 0  Injury with Fall? 0  Risk for fall due to : No Fall Risks  Follow up Falls evaluation completed    Most recent depression screenings:    05/04/2022    3:54 PM 03/14/2021    2:22 PM  PHQ 2/9 Scores  PHQ - 2 Score 0 0   Most recent cognitive screening:     No data to display         Most recent Audit-C alcohol use screening     No data to display         A score of 3 or more in women, and 4 or more in men indicates increased risk for alcohol abuse, EXCEPT if all of the points are from question 1    Vision/Hearing Screen: No results found.  {Labs (Optional):23779}  No results found for any visits on 05/04/22.    Assessment & Plan   Annual wellness visit done today including the all of the following: Reviewed patient's Family Medical History Reviewed and updated list of patient's medical providers Assessment of cognitive impairment was done Assessed patient's functional ability Established a written schedule for health screening [place] Completed and Reviewed  Exercise Activities and Dietary recommendations  Goals   None     Immunization History  Administered Date(s) Administered   Hepatitis B 10/27/1993, 11/29/1993, 06/02/1994   Hepatitis B, PED/ADOLESCENT 10/27/1993, 11/29/1993, 06/02/1994   Influenza, Quadrivalent, Recombinant, Inj, Pf 12/26/2018   Influenza-Unspecified 01/23/2017, 12/25/2017, 12/26/2018, 01/29/2021   MMR 10/08/1991   PFIZER(Purple Top)SARS-COV-2 Vaccination 11/07/2019, 12/05/2019   Td 12/14/1995, 12/29/2005    Health Maintenance  Topic Date Due   DTaP/Tdap/Td (3 - Tdap) 12/30/2015   INFLUENZA VACCINE  06/25/2022 (Originally 10/25/2021)   Hepatitis C Screening  05/05/2023 (Originally 10/05/1999)   COVID-19 Vaccine (3 - Pfizer risk series) 05/21/2023 (Originally 01/02/2020)   PAP SMEAR-Modifier  05/10/2025   HIV Screening  Completed   Pneumococcal Vaccine 5-15 Years old  Aged Out   HPV VACCINES  Aged Out     Discussed health benefits of physical activity, and encouraged her to engage in regular exercise appropriate for her age and condition.    Problem List Items Addressed This Visit   None Visit Diagnoses     Acute non-recurrent sinusitis, unspecified location    -  Primary   Relevant Medications   azithromycin (ZITHROMAX) 250 MG tablet       No follow-ups on file.     Beatrice Lecher, MD      Past medical history, Surgical history, Family history not pertinant except as noted below, Social  history, Allergies, and medications have been entered into the medical record, reviewed, and corrections made.    Objective:    General: Speaking clearly in complete sentences without any shortness of breath.  Alert and oriented x3.  Normal judgment. No apparent acute distress.    Impression and Recommendations:    Problem List Items Addressed This Visit   None Visit Diagnoses     Acute non-recurrent sinusitis, unspecified location    -  Primary   Relevant Medications   azithromycin (ZITHROMAX) 250 MG tablet       No orders of the  defined types were placed in this encounter.   Meds ordered this encounter  Medications   azithromycin (ZITHROMAX) 250 MG tablet    Sig: 2 Ttabs PO on Day 1, then one a day x 4 days.    Dispense:  6 tablet    Refill:  0     I discussed the assessment and treatment plan with the patient. The patient was provided an opportunity to ask questions and all were answered. The patient agreed with the plan and demonstrated an understanding of the instructions.   The patient was advised to call back or seek an in-person evaluation if the symptoms worsen or if the condition fails to improve as anticipated.   Beatrice Lecher, MD

## 2022-05-15 ENCOUNTER — Other Ambulatory Visit: Payer: Self-pay | Admitting: Obstetrics and Gynecology

## 2022-05-15 ENCOUNTER — Telehealth: Payer: Self-pay | Admitting: Family Medicine

## 2022-05-15 DIAGNOSIS — Z789 Other specified health status: Secondary | ICD-10-CM

## 2022-05-15 DIAGNOSIS — R92321 Mammographic fibroglandular density, right breast: Secondary | ICD-10-CM | POA: Diagnosis not present

## 2022-05-15 DIAGNOSIS — R928 Other abnormal and inconclusive findings on diagnostic imaging of breast: Secondary | ICD-10-CM | POA: Diagnosis not present

## 2022-05-15 NOTE — Telephone Encounter (Signed)
Omekia from [facility] this is for Right breast diagnostic ultra sound needed . They stated the original report was from October 25, 2021.

## 2022-05-15 NOTE — Telephone Encounter (Signed)
[facility] order faxed to number provided with confirmation received.

## 2022-05-15 NOTE — Telephone Encounter (Signed)
For what specialty?  I do not know what kind of referral to put in I do not know who she has an appointment with today or for what condition?

## 2022-05-15 NOTE — Telephone Encounter (Signed)
Thgank you

## 2022-05-15 NOTE — Telephone Encounter (Signed)
Per Judy Marshall is needing an Order for patient to be seen today .Marland Kitchen Phone [phone] fax [phone].Marland Kitchen

## 2022-05-15 NOTE — Telephone Encounter (Signed)
For what speciality?

## 2022-05-18 ENCOUNTER — Encounter: Payer: Self-pay | Admitting: Obstetrics and Gynecology

## 2022-05-18 ENCOUNTER — Other Ambulatory Visit: Payer: Self-pay | Admitting: Obstetrics and Gynecology

## 2022-05-18 ENCOUNTER — Other Ambulatory Visit: Payer: Self-pay

## 2022-05-18 DIAGNOSIS — Z789 Other specified health status: Secondary | ICD-10-CM

## 2022-05-18 MED ORDER — NORETHINDRONE-ETH ESTRADIOL 1-35 MG-MCG PO TABS: 1.0000 | ORAL_TABLET | Freq: Every day | ORAL | 0 refills | Status: DC

## 2022-05-18 NOTE — Progress Notes (Signed)
One month refill of OCP sent until pt's annual appt on 06/07/22

## 2022-05-19 ENCOUNTER — Telehealth: Payer: Self-pay | Admitting: *Deleted

## 2022-05-19 ENCOUNTER — Other Ambulatory Visit: Payer: Self-pay | Admitting: Obstetrics and Gynecology

## 2022-05-19 DIAGNOSIS — Z789 Other specified health status: Secondary | ICD-10-CM

## 2022-05-19 NOTE — Telephone Encounter (Signed)
Request RF for Northindrone sent to [facility].  Pt does have an annual schedule in March.

## 2022-06-06 NOTE — Progress Notes (Unsigned)
   ANNUAL EXAM Patient name: Judy Marshall MRN [id number]  Date of birth: [birth year] Chief Complaint:   No chief complaint on file.  History of Present Illness:   Judy Marshall is a 42 y.o. G0P0000 female being seen today for a routine annual exam.   Current complaints: *** Current birth control: ***  No LMP recorded. (Menstrual status: Oral contraceptives).   Last MXR: 09/2021  - abnormal. No f/u. *** Last Pap/Pap History: 04/2020. Results were: NILM w/ HRHPV negative. H/O abnormal pap: yes   Health Maintenance Due  Topic Date Due   DTaP/Tdap/Td (3 - Tdap) 12/30/2015    Review of Systems:   Pertinent items are noted in HPI Denies any headaches, blurred vision, fatigue, shortness of breath, chest pain, abdominal pain, abnormal vaginal discharge/itching/odor/irritation, problems with periods, bowel movements, urination, or intercourse unless otherwise stated above. *** Pertinent History Reviewed:  Reviewed past medical,surgical, social and family history.  Reviewed problem list, medications and allergies. Physical Assessment:  There were no vitals filed for this visit.There is no height or weight on file to calculate BMI.   Physical Examination:  General appearance - well appearing, and in no distress Mental status - alert, oriented to person, place, and time Psych:  She has a normal mood and affect Skin - warm and dry, normal color, no suspicious lesions noted Chest - effort normal Heart - normal rate  Breasts - breasts appear normal, no suspicious masses, no skin or nipple changes or axillary nodes Abdomen - soft, nontender, nondistended, no masses or organomegaly Pelvic - *** VULVA: normal appearing vulva with no masses, tenderness or lesions  VAGINA: normal appearing vagina with normal color and discharge, no lesions  CERVIX: normal appearing cervix without discharge or lesions, no CMT UTERUS: uterus is felt to be normal size, shape, consistency and nontender   ADNEXA: No adnexal masses or tenderness noted. Extremities:  No swelling or varicosities noted  Chaperone present for exam  No results found for this or any previous visit (from the past 24 hour(s)).  Assessment & Plan:  Diagnoses and all orders for this visit:  History of CIN I and cryotherapy in 2016  Encounter for annual routine gynecological examination  - Cervical cancer screening: Discussed guidelines. Pap with HPV wnl 04/2020. Due no sooner than 04/2023.  - STD Testing: not indicated - Birth Control: *** - Breast Health: Encouraged self breast awareness/SBE. Teaching provided. Discussed limits of clinical breast exam for detecting breast cancer. Rx given for MXR. Had abnormal mammogram last July but did not complete f/u imaging. - F/U 12 months and prn    No orders of the defined types were placed in this encounter.   Meds: No orders of the defined types were placed in this encounter.   Follow-up: No follow-ups on file.  Judy Gunning, MD 06/06/2022 1:04 PM

## 2022-06-07 ENCOUNTER — Ambulatory Visit (INDEPENDENT_AMBULATORY_CARE_PROVIDER_SITE_OTHER): Payer: BC Managed Care – PPO | Admitting: Obstetrics and Gynecology

## 2022-06-07 ENCOUNTER — Encounter: Payer: Self-pay | Admitting: Obstetrics and Gynecology

## 2022-06-07 VITALS — BP 130/84 | HR 88 | Ht 66.0 in | Wt 256.0 lb

## 2022-06-07 DIAGNOSIS — Z01419 Encounter for gynecological examination (general) (routine) without abnormal findings: Secondary | ICD-10-CM | POA: Diagnosis not present

## 2022-06-07 DIAGNOSIS — N870 Mild cervical dysplasia: Secondary | ICD-10-CM | POA: Diagnosis not present

## 2022-06-07 MED ORDER — NORETHINDRONE 0.35 MG PO TABS: 1.0000 | ORAL_TABLET | Freq: Every day | ORAL | 3 refills | Status: DC

## 2022-07-02 ENCOUNTER — Other Ambulatory Visit: Payer: Self-pay | Admitting: Family Medicine

## 2022-07-02 DIAGNOSIS — I10 Essential (primary) hypertension: Secondary | ICD-10-CM

## 2022-07-03 ENCOUNTER — Encounter: Payer: Self-pay | Admitting: Family Medicine

## 2022-07-07 MED ORDER — POTASSIUM CHLORIDE ER 20 MEQ PO TBCR: 20.0000 meq | EXTENDED_RELEASE_TABLET | Freq: Two times a day (BID) | ORAL | 0 refills | Status: DC

## 2022-08-04 ENCOUNTER — Encounter: Payer: Self-pay | Admitting: Obstetrics and Gynecology

## 2022-08-16 DIAGNOSIS — M4725 Other spondylosis with radiculopathy, thoracolumbar region: Secondary | ICD-10-CM | POA: Diagnosis not present

## 2022-08-16 DIAGNOSIS — M9902 Segmental and somatic dysfunction of thoracic region: Secondary | ICD-10-CM | POA: Diagnosis not present

## 2022-08-16 DIAGNOSIS — M9904 Segmental and somatic dysfunction of sacral region: Secondary | ICD-10-CM | POA: Diagnosis not present

## 2022-08-16 DIAGNOSIS — M9903 Segmental and somatic dysfunction of lumbar region: Secondary | ICD-10-CM | POA: Diagnosis not present

## 2022-08-17 DIAGNOSIS — M9902 Segmental and somatic dysfunction of thoracic region: Secondary | ICD-10-CM | POA: Diagnosis not present

## 2022-08-17 DIAGNOSIS — M4728 Other spondylosis with radiculopathy, sacral and sacrococcygeal region: Secondary | ICD-10-CM | POA: Diagnosis not present

## 2022-08-17 DIAGNOSIS — M4725 Other spondylosis with radiculopathy, thoracolumbar region: Secondary | ICD-10-CM | POA: Diagnosis not present

## 2022-08-17 DIAGNOSIS — M4726 Other spondylosis with radiculopathy, lumbar region: Secondary | ICD-10-CM | POA: Diagnosis not present

## 2022-10-02 ENCOUNTER — Telehealth: Payer: Self-pay | Admitting: Family Medicine

## 2022-10-02 MED ORDER — POTASSIUM CHLORIDE ER 20 MEQ PO TBCR: 20.0000 meq | EXTENDED_RELEASE_TABLET | Freq: Two times a day (BID) | ORAL | 0 refills | Status: DC

## 2022-10-02 NOTE — Telephone Encounter (Signed)
Medication sent to pt's local pharmacy.

## 2022-10-02 NOTE — Telephone Encounter (Signed)
Pt called. She is requesting a refill on her Potassium 20 mg. Appointment scheduled for July 30.

## 2022-10-10 ENCOUNTER — Other Ambulatory Visit: Payer: Self-pay | Admitting: Family Medicine

## 2022-10-10 DIAGNOSIS — I10 Essential (primary) hypertension: Secondary | ICD-10-CM

## 2022-10-16 DIAGNOSIS — M4725 Other spondylosis with radiculopathy, thoracolumbar region: Secondary | ICD-10-CM | POA: Diagnosis not present

## 2022-10-16 DIAGNOSIS — M4728 Other spondylosis with radiculopathy, sacral and sacrococcygeal region: Secondary | ICD-10-CM | POA: Diagnosis not present

## 2022-10-16 DIAGNOSIS — M4726 Other spondylosis with radiculopathy, lumbar region: Secondary | ICD-10-CM | POA: Diagnosis not present

## 2022-10-16 DIAGNOSIS — M9902 Segmental and somatic dysfunction of thoracic region: Secondary | ICD-10-CM | POA: Diagnosis not present

## 2022-10-17 DIAGNOSIS — M4728 Other spondylosis with radiculopathy, sacral and sacrococcygeal region: Secondary | ICD-10-CM | POA: Diagnosis not present

## 2022-10-17 DIAGNOSIS — M4725 Other spondylosis with radiculopathy, thoracolumbar region: Secondary | ICD-10-CM | POA: Diagnosis not present

## 2022-10-17 DIAGNOSIS — M4726 Other spondylosis with radiculopathy, lumbar region: Secondary | ICD-10-CM | POA: Diagnosis not present

## 2022-10-17 DIAGNOSIS — M9902 Segmental and somatic dysfunction of thoracic region: Secondary | ICD-10-CM | POA: Diagnosis not present

## 2022-10-18 DIAGNOSIS — M4728 Other spondylosis with radiculopathy, sacral and sacrococcygeal region: Secondary | ICD-10-CM | POA: Diagnosis not present

## 2022-10-18 DIAGNOSIS — M4725 Other spondylosis with radiculopathy, thoracolumbar region: Secondary | ICD-10-CM | POA: Diagnosis not present

## 2022-10-18 DIAGNOSIS — M9902 Segmental and somatic dysfunction of thoracic region: Secondary | ICD-10-CM | POA: Diagnosis not present

## 2022-10-18 DIAGNOSIS — M4726 Other spondylosis with radiculopathy, lumbar region: Secondary | ICD-10-CM | POA: Diagnosis not present

## 2022-10-24 ENCOUNTER — Ambulatory Visit: Payer: BC Managed Care – PPO | Admitting: Family Medicine

## 2022-12-28 ENCOUNTER — Other Ambulatory Visit: Payer: Self-pay | Admitting: Family Medicine

## 2023-01-24 ENCOUNTER — Other Ambulatory Visit: Payer: Self-pay | Admitting: Family Medicine

## 2023-01-24 DIAGNOSIS — I10 Essential (primary) hypertension: Secondary | ICD-10-CM

## 2023-01-25 NOTE — Telephone Encounter (Signed)
Plz call pt she will need an OV  for bp med refills thanks

## 2023-01-26 NOTE — Telephone Encounter (Signed)
Patient has ben scheduled for 11/8

## 2023-01-30 DIAGNOSIS — D225 Melanocytic nevi of trunk: Secondary | ICD-10-CM | POA: Diagnosis not present

## 2023-01-30 DIAGNOSIS — L821 Other seborrheic keratosis: Secondary | ICD-10-CM | POA: Diagnosis not present

## 2023-01-30 DIAGNOSIS — B354 Tinea corporis: Secondary | ICD-10-CM | POA: Diagnosis not present

## 2023-01-30 DIAGNOSIS — L814 Other melanin hyperpigmentation: Secondary | ICD-10-CM | POA: Diagnosis not present

## 2023-01-30 DIAGNOSIS — L858 Other specified epidermal thickening: Secondary | ICD-10-CM | POA: Diagnosis not present

## 2023-02-02 ENCOUNTER — Ambulatory Visit: Payer: BC Managed Care – PPO | Admitting: Family Medicine

## 2023-02-28 ENCOUNTER — Other Ambulatory Visit: Payer: Self-pay | Admitting: Family Medicine

## 2023-02-28 DIAGNOSIS — I10 Essential (primary) hypertension: Secondary | ICD-10-CM

## 2023-03-05 ENCOUNTER — Ambulatory Visit: Payer: BC Managed Care – PPO | Admitting: Family Medicine

## 2023-03-05 ENCOUNTER — Encounter: Payer: Self-pay | Admitting: Family Medicine

## 2023-03-05 VITALS — BP 128/74 | HR 84 | Ht 66.0 in | Wt 244.0 lb

## 2023-03-05 DIAGNOSIS — J069 Acute upper respiratory infection, unspecified: Secondary | ICD-10-CM

## 2023-03-05 DIAGNOSIS — I10 Essential (primary) hypertension: Secondary | ICD-10-CM

## 2023-03-05 DIAGNOSIS — E876 Hypokalemia: Secondary | ICD-10-CM

## 2023-03-05 DIAGNOSIS — R7301 Impaired fasting glucose: Secondary | ICD-10-CM

## 2023-03-05 DIAGNOSIS — F4321 Adjustment disorder with depressed mood: Secondary | ICD-10-CM | POA: Diagnosis not present

## 2023-03-05 LAB — POCT GLYCOSYLATED HEMOGLOBIN (HGB A1C): Hemoglobin A1C: 5.8 % — AB (ref 4.0–5.6)

## 2023-03-05 LAB — POC COVID19 BINAXNOW: SARS Coronavirus 2 Ag: NEGATIVE

## 2023-03-05 MED ORDER — POTASSIUM CHLORIDE ER 20 MEQ PO TBCR: 20.0000 meq | EXTENDED_RELEASE_TABLET | Freq: Two times a day (BID) | ORAL | 1 refills | Status: DC

## 2023-03-05 MED ORDER — CHLORTHALIDONE 25 MG PO TABS: 25.0000 mg | ORAL_TABLET | Freq: Every day | ORAL | 1 refills | Status: DC

## 2023-03-05 NOTE — Assessment & Plan Note (Signed)
Encouraged her to reach out if she is struggling.  Can consider counseling through hospice as well.

## 2023-03-05 NOTE — Progress Notes (Signed)
Established Patient Office Visit  Subjective   Patient ID: Judy Marshall, female    DOB: [birth year]  Age: 42 y.o. MRN: [id number]  Chief Complaint  Patient presents with   Hypertension    HPI  Hypertension- Pt denies chest pain, SOB, dizziness, or heart palpitations.  Taking meds as directed w/o problems.  Denies medication side effects.    Impaired fasting glucose-no increased thirst or urination. No symptoms consistent with hypoglycemia.  She is also had to have sinus congestion scratchy throat and itchy irritated eyes.  She says her dad passed away in January 02, 2023 from pulmonary fibrosis.  And her uncle just died.    ROS    Objective:     BP 128/74   Pulse 84   Ht 5\' 6"  (1.676 m)   Wt 244 lb (110.7 kg)   SpO2 100%   BMI 39.38 kg/m    Physical Exam Constitutional:      Appearance: Normal appearance.  HENT:     Head: Normocephalic and atraumatic.     Right Ear: Tympanic membrane, ear canal and external ear normal. There is no impacted cerumen.     Left Ear: Tympanic membrane, ear canal and external ear normal. There is no impacted cerumen.     Nose: Nose normal.     Mouth/Throat:     Pharynx: Oropharynx is clear.  Eyes:     Conjunctiva/sclera: Conjunctivae normal.  Cardiovascular:     Rate and Rhythm: Normal rate and regular rhythm.  Pulmonary:     Effort: Pulmonary effort is normal.     Breath sounds: Normal breath sounds.  Musculoskeletal:     Cervical back: Neck supple. No tenderness.  Lymphadenopathy:     Cervical: No cervical adenopathy.  Skin:    General: Skin is warm and dry.  Neurological:     Mental Status: She is alert and oriented to person, place, and time.  Psychiatric:        Mood and Affect: Mood normal.      Results for orders placed or performed in visit on 03/05/23  POCT HgB A1C  Result Value Ref Range   Hemoglobin A1C 5.8 (A) 4.0 - 5.6 %   HbA1c POC (<> result, manual entry)     HbA1c, POC (prediabetic range)     HbA1c,  POC (controlled diabetic range)    POC COVID-19  Result Value Ref Range   SARS Coronavirus 2 Ag Negative Negative      The 10-year ASCVD risk score (Arnett DK, et al., 2019) is: 0.6%    Assessment & Plan:   Problem List Items Addressed This Visit       Cardiovascular and Mediastinum   Hypertension goal BP (blood pressure) < 130/80   Relevant Medications   chlorthalidone (HYGROTON) 25 MG tablet   Other Relevant Orders   CMP14+EGFR   Lipid Panel With LDL/HDL Ratio     Endocrine   IFG (impaired fasting glucose) - Primary    1C looks great today at 5.8.  Continue current regimen and follow-up in 6 months.  Continue to work on healthy diet and regular exercise.      Relevant Orders   POCT HgB A1C (Completed)   COMPLETE METABOLIC PANEL WITH GFR   CMP14+EGFR   Lipid Panel With LDL/HDL Ratio     Other   Hypokalemia   Relevant Medications   Potassium Chloride ER 20 MEQ TBCR   Other Relevant Orders   COMPLETE METABOLIC PANEL WITH GFR  Grief    Encouraged her to reach out if she is struggling.  Can consider counseling through hospice as well.        Other Visit Diagnoses     Upper respiratory tract infection, unspecified type       Relevant Orders   POC COVID-19 (Completed)      Upper respiratory infection-likely viral and negative for COVID today.  Recommend symptomatic care if not feeling better by the end of the week please let us know.  Return in about 6 months (around 09/03/2023) for Hypertension.    Nani Gasser, MD

## 2023-03-05 NOTE — Assessment & Plan Note (Signed)
1C looks great today at 5.8.  Continue current regimen and follow-up in 6 months.  Continue to work on healthy diet and regular exercise.

## 2023-03-06 LAB — LIPID PANEL WITH LDL/HDL RATIO
Cholesterol, Total: 155 mg/dL (ref 100–199)
HDL: 35 mg/dL — ABNORMAL LOW (ref >39)
LDL Chol Calc (NIH): 96 mg/dL (ref 0–99)
LDL/HDL Ratio: 2.7 {ratio} (ref 0.0–3.2)
Triglycerides: 134 mg/dL (ref 0–149)
VLDL Cholesterol Cal: 24 mg/dL (ref 5–40)

## 2023-03-06 LAB — CMP14+EGFR
ALT: 15 [IU]/L (ref 0–32)
AST: 21 [IU]/L (ref 0–40)
Albumin: 4 g/dL (ref 3.9–4.9)
Alkaline Phosphatase: 98 [IU]/L (ref 44–121)
BUN/Creatinine Ratio: 22 (ref 9–23)
BUN: 15 mg/dL (ref 6–24)
Bilirubin Total: 0.2 mg/dL (ref 0.0–1.2)
CO2: 26 mmol/L (ref 20–29)
Calcium: 9.2 mg/dL (ref 8.7–10.2)
Chloride: 97 mmol/L (ref 96–106)
Creatinine, Ser: 0.69 mg/dL (ref 0.57–1.00)
Globulin, Total: 3.2 g/dL (ref 1.5–4.5)
Glucose: 116 mg/dL — ABNORMAL HIGH (ref 70–99)
Potassium: 2.9 mmol/L — ABNORMAL LOW (ref 3.5–5.2)
Sodium: 139 mmol/L (ref 134–144)
Total Protein: 7.2 g/dL (ref 6.0–8.5)
eGFR: 112 mL/min/{1.73_m2} (ref >59)

## 2023-03-07 ENCOUNTER — Encounter: Payer: Self-pay | Admitting: Family Medicine

## 2023-03-07 DIAGNOSIS — E876 Hypokalemia: Secondary | ICD-10-CM

## 2023-03-07 NOTE — Progress Notes (Signed)
Hi Judy Marshall, and he is had some cold symptoms when you were here the other day but your calcium is really low.  You still taking your potassium or have you run out recently?  If you are not on it please restart and that I definitely want to recheck your potassium at the end of this week.

## 2023-03-08 DIAGNOSIS — E876 Hypokalemia: Secondary | ICD-10-CM | POA: Diagnosis not present

## 2023-03-09 LAB — POTASSIUM: Potassium: 3.1 mmol/L — ABNORMAL LOW (ref 3.5–5.2)

## 2023-03-30 DIAGNOSIS — Z789 Other specified health status: Secondary | ICD-10-CM | POA: Diagnosis not present

## 2023-05-09 DIAGNOSIS — R1013 Epigastric pain: Secondary | ICD-10-CM | POA: Diagnosis not present

## 2023-06-11 ENCOUNTER — Ambulatory Visit: Payer: BC Managed Care – PPO | Admitting: Obstetrics and Gynecology

## 2023-06-11 NOTE — Progress Notes (Unsigned)
 ANNUAL EXAM Patient name: Judy Marshall MRN [id number]  Date of birth: Nov 12, 1980 Chief Complaint:   No chief complaint on file.  History of Present Illness:   Judy Marshall is a 43 y.o. G0P0000 female being seen today for a routine annual exam.   Discussed the use of AI scribe software for clinical note transcription with the patient, who gave verbal consent to proceed.  She has a cyst in the right breast, which has been monitored over time. Previous evaluations, including a diagnostic mammogram and ultrasound in February, have shown that the cysts have either decreased in size or remained stable.  She is due for a Pap smear today. She has a history of an abnormal Pap smear around 2015-2016, for which she underwent a cryo procedure in the office. She has not had any further abnormal results since then.  She is currently on a progesterone-only pill and reports no issues with it. Her last menstrual period was heavy and occurred after her Pap smear last year, but she has not had any periods since then, which she notes is typical for her on this medication.   No LMP recorded. (Menstrual status: Oral contraceptives).   Last MXR: No new imaging since 04/2022  Last Pap/Pap History:  H/O abnormal pap: yes (2015-16) - had cryo here 04/2020. Results were: NILM w/ HRHPV negative.   Health Maintenance Due  Topic Date Due   Pneumococcal Vaccine 31-8 Years old (1 of 2 - PCV) Never done   Hepatitis C Screening  Never done   COVID-19 Vaccine (3 - Pfizer risk series) 01/02/2020    Review of Systems:   Pertinent items are noted in HPI Denies any headaches, blurred vision, fatigue, shortness of breath, chest pain, abdominal pain, abnormal vaginal discharge/itching/odor/irritation, problems with periods, bowel movements, urination, or intercourse unless otherwise stated above.  Pertinent History Reviewed:  Reviewed past medical,surgical, social and family history.  Reviewed problem  list, medications and allergies. Physical Assessment:   There were no vitals filed for this visit. There is no height or weight on file to calculate BMI.   Physical Examination:  General appearance - well appearing, and in no distress Mental status - alert, oriented to person, place, and time Psych:  She has a normal mood and affect Skin - warm and dry, normal color, no suspicious lesions noted Chest - effort normal Heart - normal rate  Breasts - breasts appear normal, no suspicious masses, no skin or nipple changes or axillary nodes Abdomen - soft, nontender, nondistended, no masses or organomegaly Speculum Exam: Normal EFG, normal appearing vaginal mucosa and urethra. Normal appearing cervix.  Pelvic - Pt declines, asymptomatic.  Extremities:  No swelling or varicosities noted  Chaperone present for exam  No results found for this or any previous visit (from the past 24 hours).  Assessment & Plan:  Judy Marshall was seen today for annual exam.  Diagnoses and all orders for this visit:  History of CIN I and cryotherapy in 2016  Encounter for annual routine gynecological examination -     norethindrone (MICRONOR) 0.35 MG tablet; Take 1 tablet (0.35 mg total) by mouth daily. -     MM 3D SCREENING MAMMOGRAM BILATERAL BREAST; Future - Cervical cancer screening: Discussed guidelines. Pap with HPV done - STD Testing: not indicated - Birth Control: POP - Breast Health: Encouraged self breast awareness/SBE. Teaching provided. Discussed limits of clinical breast exam for detecting breast cancer. Rx given for MXR for diagnostic imaging.  - F/U  12 months and prn    No orders of the defined types were placed in this encounter.   Meds:  No orders of the defined types were placed in this encounter.   Follow-up: No follow-ups on file.  Milas Hock, MD 06/11/2023 1:03 PM

## 2023-06-14 ENCOUNTER — Ambulatory Visit (INDEPENDENT_AMBULATORY_CARE_PROVIDER_SITE_OTHER): Admitting: Obstetrics and Gynecology

## 2023-06-14 ENCOUNTER — Other Ambulatory Visit (HOSPITAL_COMMUNITY)
Admission: RE | Admit: 2023-06-14 | Discharge: 2023-06-14 | Disposition: A | Discharge status: Home / Self Care | Source: Ambulatory Visit | Attending: Obstetrics and Gynecology | Admitting: Obstetrics and Gynecology

## 2023-06-14 ENCOUNTER — Encounter: Payer: Self-pay | Admitting: Obstetrics and Gynecology

## 2023-06-14 VITALS — BP 118/82 | Ht 66.0 in | Wt 246.0 lb

## 2023-06-14 DIAGNOSIS — N870 Mild cervical dysplasia: Secondary | ICD-10-CM

## 2023-06-14 DIAGNOSIS — Z01419 Encounter for gynecological examination (general) (routine) without abnormal findings: Secondary | ICD-10-CM | POA: Diagnosis not present

## 2023-06-14 MED ORDER — NORETHINDRONE 0.35 MG PO TABS
1.0000 | ORAL_TABLET | Freq: Every day | ORAL | 3 refills | Status: AC
Start: 2023-06-14 — End: ?

## 2023-06-15 ENCOUNTER — Other Ambulatory Visit: Payer: Self-pay | Admitting: Obstetrics and Gynecology

## 2023-06-15 DIAGNOSIS — Z01419 Encounter for gynecological examination (general) (routine) without abnormal findings: Secondary | ICD-10-CM

## 2023-06-15 LAB — CYTOLOGY - PAP
Adequacy: ABSENT
Comment: NEGATIVE
Diagnosis: NEGATIVE
High risk HPV: NEGATIVE

## 2023-06-16 ENCOUNTER — Encounter: Payer: Self-pay | Admitting: Obstetrics and Gynecology

## 2023-08-01 DIAGNOSIS — M4721 Other spondylosis with radiculopathy, occipito-atlanto-axial region: Secondary | ICD-10-CM | POA: Diagnosis not present

## 2023-08-01 DIAGNOSIS — M4728 Other spondylosis with radiculopathy, sacral and sacrococcygeal region: Secondary | ICD-10-CM | POA: Diagnosis not present

## 2023-08-01 DIAGNOSIS — M25551 Pain in right hip: Secondary | ICD-10-CM | POA: Diagnosis not present

## 2023-08-01 DIAGNOSIS — M4726 Other spondylosis with radiculopathy, lumbar region: Secondary | ICD-10-CM | POA: Diagnosis not present

## 2023-08-02 DIAGNOSIS — M4721 Other spondylosis with radiculopathy, occipito-atlanto-axial region: Secondary | ICD-10-CM | POA: Diagnosis not present

## 2023-08-02 DIAGNOSIS — M4726 Other spondylosis with radiculopathy, lumbar region: Secondary | ICD-10-CM | POA: Diagnosis not present

## 2023-08-02 DIAGNOSIS — M4728 Other spondylosis with radiculopathy, sacral and sacrococcygeal region: Secondary | ICD-10-CM | POA: Diagnosis not present

## 2023-08-02 DIAGNOSIS — M25551 Pain in right hip: Secondary | ICD-10-CM | POA: Diagnosis not present

## 2023-08-07 DIAGNOSIS — N6489 Other specified disorders of breast: Secondary | ICD-10-CM | POA: Diagnosis not present

## 2023-08-07 DIAGNOSIS — R92321 Mammographic fibroglandular density, right breast: Secondary | ICD-10-CM | POA: Diagnosis not present

## 2023-08-07 DIAGNOSIS — R92323 Mammographic fibroglandular density, bilateral breasts: Secondary | ICD-10-CM | POA: Diagnosis not present

## 2023-08-07 DIAGNOSIS — R928 Other abnormal and inconclusive findings on diagnostic imaging of breast: Secondary | ICD-10-CM | POA: Diagnosis not present

## 2023-08-07 DIAGNOSIS — N6310 Unspecified lump in the right breast, unspecified quadrant: Secondary | ICD-10-CM | POA: Diagnosis not present

## 2023-08-07 LAB — HM MAMMOGRAPHY

## 2023-08-08 ENCOUNTER — Encounter

## 2023-08-08 ENCOUNTER — Other Ambulatory Visit

## 2023-08-30 DIAGNOSIS — M4721 Other spondylosis with radiculopathy, occipito-atlanto-axial region: Secondary | ICD-10-CM | POA: Diagnosis not present

## 2023-08-30 DIAGNOSIS — M25551 Pain in right hip: Secondary | ICD-10-CM | POA: Diagnosis not present

## 2023-08-30 DIAGNOSIS — M4726 Other spondylosis with radiculopathy, lumbar region: Secondary | ICD-10-CM | POA: Diagnosis not present

## 2023-08-30 DIAGNOSIS — M4728 Other spondylosis with radiculopathy, sacral and sacrococcygeal region: Secondary | ICD-10-CM | POA: Diagnosis not present

## 2023-09-03 ENCOUNTER — Ambulatory Visit: Payer: BC Managed Care – PPO | Admitting: Family Medicine

## 2023-09-03 ENCOUNTER — Encounter: Payer: Self-pay | Admitting: Family Medicine

## 2023-09-03 VITALS — BP 124/68 | HR 65 | Ht 66.0 in | Wt 250.6 lb

## 2023-09-03 DIAGNOSIS — D649 Anemia, unspecified: Secondary | ICD-10-CM

## 2023-09-03 DIAGNOSIS — I10 Essential (primary) hypertension: Secondary | ICD-10-CM

## 2023-09-03 DIAGNOSIS — K2100 Gastro-esophageal reflux disease with esophagitis, without bleeding: Secondary | ICD-10-CM

## 2023-09-03 DIAGNOSIS — R7301 Impaired fasting glucose: Secondary | ICD-10-CM

## 2023-09-03 DIAGNOSIS — M4721 Other spondylosis with radiculopathy, occipito-atlanto-axial region: Secondary | ICD-10-CM | POA: Diagnosis not present

## 2023-09-03 DIAGNOSIS — M4728 Other spondylosis with radiculopathy, sacral and sacrococcygeal region: Secondary | ICD-10-CM | POA: Diagnosis not present

## 2023-09-03 DIAGNOSIS — M25551 Pain in right hip: Secondary | ICD-10-CM | POA: Diagnosis not present

## 2023-09-03 DIAGNOSIS — M4726 Other spondylosis with radiculopathy, lumbar region: Secondary | ICD-10-CM | POA: Diagnosis not present

## 2023-09-03 LAB — POCT GLYCOSYLATED HEMOGLOBIN (HGB A1C): Hemoglobin A1C: 5.5 % (ref 4.0–5.6)

## 2023-09-03 MED ORDER — OMEPRAZOLE 40 MG PO CPDR: 40.0000 mg | DELAYED_RELEASE_CAPSULE | Freq: Every day | ORAL | 3 refills | Status: AC

## 2023-09-03 NOTE — Assessment & Plan Note (Addendum)
 Pressure looks fantastic today continue chlorthalidone  and potassium.  She actually had labs in February so plan to recheck again in August.

## 2023-09-03 NOTE — Assessment & Plan Note (Signed)
 A!C looks great today at 5.5.  doing really well with diet changes. Will monitor.

## 2023-09-03 NOTE — Progress Notes (Signed)
 Established Patient Office Visit  Subjective  Patient ID: Judy Marshall, female    DOB: Aug 12, 1980  Age: 43 y.o. MRN: [id number]  No chief complaint on file.   HPI  Hypertension- Pt denies chest pain, SOB, dizziness, or heart palpitations.  Taking meds as directed w/o problems.  Denies medication side effects.       ROS    Objective:     BP 124/68   Pulse 65   Ht 5\' 6"  (1.676 m)   Wt 250 lb 9.6 oz (113.7 kg)   SpO2 100%   BMI 40.45 kg/m    Physical Exam Vitals and nursing note reviewed.  Constitutional:      Appearance: Normal appearance.  HENT:     Head: Normocephalic and atraumatic.  Eyes:     Conjunctiva/sclera: Conjunctivae normal.  Cardiovascular:     Rate and Rhythm: Normal rate and regular rhythm.  Pulmonary:     Effort: Pulmonary effort is normal.     Breath sounds: Normal breath sounds.  Skin:    General: Skin is warm and dry.  Neurological:     Mental Status: She is alert.  Psychiatric:        Mood and Affect: Mood normal.      Results for orders placed or performed in visit on 09/03/23  POCT HgB A1C  Result Value Ref Range   Hemoglobin A1C 5.5 4.0 - 5.6 %   HbA1c POC (<> result, manual entry)     HbA1c, POC (prediabetic range)     HbA1c, POC (controlled diabetic range)        The 10-year ASCVD risk score (Arnett DK, et al., 2019) is: 1.1%    Assessment & Plan:   Problem List Items Addressed This Visit       Cardiovascular and Mediastinum   Hypertension goal BP (blood pressure) < 130/80   Pressure looks fantastic today continue chlorthalidone  and potassium.  She actually had labs in February so plan to recheck again in August.      Relevant Orders   CMP14+EGFR   CBC with Differential/Platelet   Fe+TIBC+Fer     Digestive   GERD (gastroesophageal reflux disease)   She has been taking over-the-counter 2 tabs of 20 mg omeprazole and wanted to see if we could write it as a prescription as it is covered under her pharmacy  benefits.  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  We did discuss that when she feels like her symptoms are well-controlled I would like for her to try to go back down to the 20 mg to see if she tolerates that.      Relevant Medications   omeprazole (PRILOSEC) 40 MG capsule     Endocrine   IFG (impaired fasting glucose) - Primary   A!C looks great today at 5.5.  doing really well with diet changes. Will monitor.        Relevant Orders   POCT HgB A1C (Completed)   CMP14+EGFR   CBC with Differential/Platelet   Fe+TIBC+Fer   Other Visit Diagnoses       Low hemoglobin       Relevant Orders   CMP14+EGFR   CBC with Differential/Platelet   Fe+TIBC+Fer      hemoglobin was a little low in February so we  will plan to recheck again in August and we will check her iron panel  Return in about 6 months (around 03/11/2024) for Hypertension.    Duaine German, MD

## 2023-09-03 NOTE — Patient Instructions (Signed)
 Recheck labs in August.

## 2023-09-03 NOTE — Assessment & Plan Note (Addendum)
 She has been taking over-the-counter 2 tabs of 20 mg omeprazole and wanted to see if we could write it as a prescription as it is covered under her pharmacy benefits.  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  We did discuss that when she feels like her symptoms are well-controlled I would like for her to try to go back down to the 20 mg to see if she tolerates that.

## 2023-09-06 DIAGNOSIS — M4726 Other spondylosis with radiculopathy, lumbar region: Secondary | ICD-10-CM | POA: Diagnosis not present

## 2023-09-06 DIAGNOSIS — M4728 Other spondylosis with radiculopathy, sacral and sacrococcygeal region: Secondary | ICD-10-CM | POA: Diagnosis not present

## 2023-09-06 DIAGNOSIS — M25551 Pain in right hip: Secondary | ICD-10-CM | POA: Diagnosis not present

## 2023-09-06 DIAGNOSIS — M4721 Other spondylosis with radiculopathy, occipito-atlanto-axial region: Secondary | ICD-10-CM | POA: Diagnosis not present

## 2023-10-02 ENCOUNTER — Other Ambulatory Visit: Payer: Self-pay | Admitting: *Deleted

## 2023-10-02 DIAGNOSIS — E876 Hypokalemia: Secondary | ICD-10-CM

## 2023-10-02 MED ORDER — POTASSIUM CHLORIDE ER 20 MEQ PO TBCR: 20.0000 meq | EXTENDED_RELEASE_TABLET | Freq: Two times a day (BID) | ORAL | 1 refills | Status: DC

## 2023-12-19 ENCOUNTER — Ambulatory Visit: Payer: Self-pay | Admitting: *Deleted

## 2023-12-19 NOTE — Telephone Encounter (Signed)
 FYI Only or Action Required?: FYI only for provider.  Patient was last seen in primary care on 09/03/2023 by Alvan Dorothyann BIRCH, MD.  Called Nurse Triage reporting Diarrhea.  Symptoms began today.  Interventions attempted: Rest, hydration, or home remedies.  Symptoms are: gradually worsening.  Triage Disposition: See PCP When Office is Open (Within 3 Days)  Patient/caregiver understands and will follow disposition?: Yes          Copied from CRM #[id number]. Topic: Clinical - Red Word Triage >> Dec 19, 2023  8:06 AM Zane F wrote: Red Word that prompted transfer to Nurse Triage:   Concern: IBS flare up (believes grief, anxiety and stress is the cause)  (August she lost her mother; Her father the previous year; New role)  [facility] and they advised patient to be seen and get FMLA  Symptoms:   Acid reflux  Upset stomach/ discomfort  When did the symptoms start?: Last month (August she lost her mother; Her father the previous year; New role)   What have you done to aid in the concern ? Have you taken anything to assist with the matter?: No due to it being controlled Reason for Disposition  [1] MILD diarrhea (e.g., 1-3 or more stools than normal in past 24 hours) AND [2] present >  7 days  (Exception: Chronic diarrhea that is not worse.)  Answer Assessment - Initial Assessment Questions Appt tomorrow already scheduled. Due to stress , anxiety reports IBS flare up and missing work. Patient reports she is being seen for anxiety and stress at work and requesting to see PCP regarding FMLA paperwork for job.        1. DIARRHEA SEVERITY: How bad is the diarrhea? How many more stools have you had in the past 24 hours than normal?      Diarrhea this am x 2 2. ONSET: When did the diarrhea begin?      Episode this am  3. STOOL DESCRIPTION:  How loose or watery is the diarrhea? What is the stool color? Is there any blood or mucous in the stool?      Loose no blood in stool 4. VOMITING: Are you also vomiting? If Yes, ask: How many times in the past 24 hours?      no 5. ABDOMEN PAIN: Are you having any abdomen pain? If Yes, ask: What does it feel like? (e.g., crampy, dull, intermittent, constant)      No  6. ABDOMEN PAIN SEVERITY: If present, ask: How bad is the pain?  (e.g., Scale 1-10; mild, moderate, or severe)     na 7. ORAL INTAKE: If vomiting, Have you been able to drink liquids? How much liquids have you had in the past 24 hours?     Able to fluids  8. HYDRATION: Any signs of dehydration? (e.g., dry mouth [not just dry lips], too weak to stand, dizziness, new weight loss) When did you last urinate?     No  9. EXPOSURE: Have you traveled to a foreign country recently? Have you been exposed to anyone with diarrhea? Could you have eaten any food that was spoiled?     na 10. ANTIBIOTIC USE: Are you taking antibiotics now or have you taken antibiotics in the past 2 months?       na 11. OTHER SYMPTOMS: Do you have any other symptoms? (e.g., fever, blood in stool)       Stress anxiety from death of parents within the last year.  12. PREGNANCY:  Is there any chance you are pregnant? When was your last menstrual period?       na  Protocols used: [facility]

## 2023-12-20 ENCOUNTER — Ambulatory Visit: Admitting: Family Medicine

## 2023-12-20 VITALS — BP 132/78 | HR 78 | Ht 66.0 in | Wt 255.1 lb

## 2023-12-20 DIAGNOSIS — K580 Irritable bowel syndrome with diarrhea: Secondary | ICD-10-CM | POA: Diagnosis not present

## 2023-12-20 DIAGNOSIS — I10 Essential (primary) hypertension: Secondary | ICD-10-CM

## 2023-12-20 DIAGNOSIS — N912 Amenorrhea, unspecified: Secondary | ICD-10-CM

## 2023-12-20 DIAGNOSIS — E876 Hypokalemia: Secondary | ICD-10-CM

## 2023-12-20 DIAGNOSIS — F4320 Adjustment disorder, unspecified: Secondary | ICD-10-CM | POA: Diagnosis not present

## 2023-12-20 DIAGNOSIS — F4321 Adjustment disorder with depressed mood: Secondary | ICD-10-CM | POA: Diagnosis not present

## 2023-12-20 DIAGNOSIS — K219 Gastro-esophageal reflux disease without esophagitis: Secondary | ICD-10-CM

## 2023-12-20 DIAGNOSIS — R790 Abnormal level of blood mineral: Secondary | ICD-10-CM | POA: Diagnosis not present

## 2023-12-20 MED ORDER — POTASSIUM CHLORIDE ER 20 MEQ PO TBCR: 20.0000 meq | EXTENDED_RELEASE_TABLET | Freq: Two times a day (BID) | ORAL | 1 refills | Status: DC

## 2023-12-20 MED ORDER — CHLORTHALIDONE 25 MG PO TABS: 25.0000 mg | ORAL_TABLET | Freq: Every day | ORAL | 1 refills | Status: DC

## 2023-12-20 NOTE — Progress Notes (Unsigned)
 Established Patient Office Visit  Subjective  Patient ID: Judy Marshall, female    DOB: 03-Oct-1980  Age: 43 y.o. MRN: [id number]  Chief Complaint  Patient presents with  . grief  . FMLA forms    Pt is requesting that Dr. Alvan fill out these forms for her to take time off intermittently     HPI  Discussed the use of AI scribe software for clinical note transcription with the patient, who gave verbal consent to proceed.  History of Present Illness Judy Marshall is a 43 year old female with irritable bowel syndrome who presents with exacerbation of symptoms due to stress.  Gastrointestinal symptoms - Exacerbation of irritable bowel syndrome symptoms attributed to recent psychosocial stressors, including bereavement and starting a new job - Severe diarrhea throughout the day yesterday, followed by residual abdominal pain and bloating today - IBS previously well-controlled for years, with prior flares triggered by menstrual cycle; symptoms stabilized after menstrual regulation - No bloody diarrhea, fevers, or chills - Certain foods, particularly sweets and highly processed foods, exacerbate symptoms - Weight loss present  Psychological stress and sleep disturbance - Significant emotional stress due to loss of parents and uncle, and starting a new job - Feelings of being 'overwhelmed' and in a 'fight or flight' mode, believed to contribute to gastrointestinal symptoms - Sleep generally good, but with difficulty falling asleep and occasional bad dreams  Gastroesophageal reflux symptoms - Mild acid reflux, perceived to be linked to IBS flare-ups - Omeprazole  provides symptomatic relief  Menstrual and reproductive history - No menstrual period in over a year      ROS    Objective:     BP 132/78   Pulse 78   Ht 5' 6 (1.676 m)   Wt 255 lb 1.3 oz (115.7 kg)   SpO2 99%   BMI 41.17 kg/m    Physical Exam Vitals and nursing note reviewed.  Constitutional:       Appearance: Normal appearance.  HENT:     Head: Normocephalic and atraumatic.  Eyes:     Conjunctiva/sclera: Conjunctivae normal.  Cardiovascular:     Rate and Rhythm: Normal rate and regular rhythm.  Pulmonary:     Effort: Pulmonary effort is normal.     Breath sounds: Normal breath sounds.  Skin:    General: Skin is warm and dry.  Neurological:     Mental Status: She is alert.  Psychiatric:        Mood and Affect: Mood normal.      Results for orders placed or performed in visit on 12/20/23  CMP14+EGFR  Result Value Ref Range   Glucose 99 70 - 99 mg/dL   BUN 16 6 - 24 mg/dL   Creatinine, Ser 9.27 0.57 - 1.00 mg/dL   eGFR 892 >40 fO/fpw/8.26   BUN/Creatinine Ratio 22 9 - 23   Sodium 140 134 - 144 mmol/L   Potassium 3.6 3.5 - 5.2 mmol/L   Chloride 101 96 - 106 mmol/L   CO2 24 20 - 29 mmol/L   Calcium 8.9 8.7 - 10.2 mg/dL   Total Protein 7.4 6.0 - 8.5 g/dL   Albumin 4.2 3.9 - 4.9 g/dL   Globulin, Total 3.2 1.5 - 4.5 g/dL   Bilirubin Total <9.7 0.0 - 1.2 mg/dL   Alkaline Phosphatase 102 41 - 116 IU/L   AST 19 0 - 40 IU/L   ALT 17 0 - 32 IU/L  CBC with Differential/Platelet  Result Value Ref Range  WBC 7.7 3.4 - 10.8 x10E3/uL   RBC 4.40 3.77 - 5.28 x10E6/uL   Hemoglobin 12.5 11.1 - 15.9 g/dL   Hematocrit 61.6 65.9 - 46.6 %   MCV 87 79 - 97 fL   MCH 28.4 26.6 - 33.0 pg   MCHC 32.6 31.5 - 35.7 g/dL   RDW 86.4 88.2 - 84.5 %   Platelets 357 150 - 450 x10E3/uL   Neutrophils 68 Not Estab. %   Lymphs 26 Not Estab. %   Monocytes 5 Not Estab. %   Eos 1 Not Estab. %   Basos 0 Not Estab. %   Neutrophils Absolute 5.3 1.4 - 7.0 x10E3/uL   Lymphocytes Absolute 2.0 0.7 - 3.1 x10E3/uL   Monocytes Absolute 0.4 0.1 - 0.9 x10E3/uL   EOS (ABSOLUTE) 0.1 0.0 - 0.4 x10E3/uL   Basophils Absolute 0.0 0.0 - 0.2 x10E3/uL   Immature Granulocytes 0 Not Estab. %   Immature Grans (Abs) 0.0 0.0 - 0.1 x10E3/uL  Iron, TIBC and Ferritin Panel  Result Value Ref Range   Total Iron  Binding Capacity 365 250 - 450 ug/dL   UIBC 667 868 - 574 ug/dL   Iron 33 27 - 840 ug/dL   Iron Saturation 9 (LL) 15 - 55 %   Ferritin 32 15 - 150 ng/mL  Progesterone   Result Value Ref Range   Progesterone  <0.1 ng/mL  Estradiol   Result Value Ref Range   Estradiol  34.2 pg/mL  Follicle stimulating hormone  Result Value Ref Range   FSH 68.5 mIU/mL  Luteinizing hormone  Result Value Ref Range   LH 32.6 mIU/mL      The 10-year ASCVD risk score (Arnett DK, et al., 2019) is: 1.3%    Assessment & Plan:   Problem List Items Addressed This Visit       Cardiovascular and Mediastinum   Hypertension goal BP (blood pressure) < 130/80   Relevant Medications   chlorthalidone  (HYGROTON ) 25 MG tablet   Other Relevant Orders   CMP14+EGFR (Completed)   CBC with Differential/Platelet (Completed)   Iron, TIBC and Ferritin Panel (Completed)   Progesterone  (Completed)   Estradiol  (Completed)   Follicle stimulating hormone (Completed)   Luteinizing hormone (Completed)     Digestive   IBS (irritable bowel syndrome)   Relevant Orders   CMP14+EGFR (Completed)   CBC with Differential/Platelet (Completed)   Iron, TIBC and Ferritin Panel (Completed)   Progesterone  (Completed)   Estradiol  (Completed)   Follicle stimulating hormone (Completed)   Luteinizing hormone (Completed)   GERD (gastroesophageal reflux disease)     Other   Hypokalemia   Relevant Medications   Potassium Chloride  ER 20 MEQ TBCR   Grief   Other Visit Diagnoses       Grief reaction    -  Primary     Amenorrhea       Relevant Orders   Progesterone  (Completed)   Estradiol  (Completed)   Follicle stimulating hormone (Completed)   Luteinizing hormone (Completed)      Assessment and Plan Assessment & Plan Irritable bowel syndrome with diarrhea Recurrent IBS with diarrhea exacerbated by stress and emotional triggers. Stress and grief identified as primary triggers. - Complete intermittent FMLA paperwork for six  months for time off during severe IBS episodes. - Consider low-dose amitriptyline at bedtime if symptoms persist despite counseling and lifestyle changes. - Consider hyoscyamine for abdominal cramping if needed. - Advise on dietary modifications, including avoiding trigger foods and considering clean eating. - Monitor for changes in symptoms,  such as blood in stool or fever, and consider stool cultures if necessary.  Adjustment disorder with depressed mood (grief-related) Adjustment disorder with depressed mood related to recent loss of family members. Emotional stress contributing to IBS symptoms. - Start grief counseling through the employee assistance program. - Complete intermittent FMLA paperwork for emotional and physical health needs.  Essential hypertension Variable blood pressure readings with recent elevations. Current medications include chlorthalidone  and potassium. - Recheck blood pressure at the end of the visit. - Refill prescriptions for chlorthalidone  and potassium for 90 days at [facility] in [place].  Perimenopausal state Possible perimenopausal state with no menstrual period for over a year. Hormone levels may help determine menopausal status. - Order hormone level testing to assess menopausal status.  GERD symptoms are not severe.  She feels like if she can get her IBS under better control the GERD will usually follow suit and improve as well.   Return in about 6 months (around 06/18/2024) for IFG/BP.    Dorothyann Byars, MD

## 2023-12-21 ENCOUNTER — Encounter: Payer: Self-pay | Admitting: Family Medicine

## 2023-12-21 ENCOUNTER — Ambulatory Visit: Payer: Self-pay | Admitting: Family Medicine

## 2023-12-21 ENCOUNTER — Telehealth: Payer: Self-pay

## 2023-12-21 LAB — CBC WITH DIFFERENTIAL/PLATELET
Basophils Absolute: 0 x10E3/uL (ref 0.0–0.2)
Basos: 0 %
EOS (ABSOLUTE): 0.1 x10E3/uL (ref 0.0–0.4)
Eos: 1 %
Hematocrit: 38.3 % (ref 34.0–46.6)
Hemoglobin: 12.5 g/dL (ref 11.1–15.9)
Immature Grans (Abs): 0 x10E3/uL (ref 0.0–0.1)
Immature Granulocytes: 0 %
Lymphocytes Absolute: 2 x10E3/uL (ref 0.7–3.1)
Lymphs: 26 %
MCH: 28.4 pg (ref 26.6–33.0)
MCHC: 32.6 g/dL (ref 31.5–35.7)
MCV: 87 fL (ref 79–97)
Monocytes Absolute: 0.4 x10E3/uL (ref 0.1–0.9)
Monocytes: 5 %
Neutrophils Absolute: 5.3 x10E3/uL (ref 1.4–7.0)
Neutrophils: 68 %
Platelets: 357 x10E3/uL (ref 150–450)
RBC: 4.4 x10E6/uL (ref 3.77–5.28)
RDW: 13.5 % (ref 11.7–15.4)
WBC: 7.7 x10E3/uL (ref 3.4–10.8)

## 2023-12-21 LAB — IRON,TIBC AND FERRITIN PANEL
Ferritin: 32 ng/mL (ref 15–150)
Iron Saturation: 9 % — CL (ref 15–55)
Iron: 33 ug/dL (ref 27–159)
Total Iron Binding Capacity: 365 ug/dL (ref 250–450)
UIBC: 332 ug/dL (ref 131–425)

## 2023-12-21 LAB — CMP14+EGFR
ALT: 17 IU/L (ref 0–32)
AST: 19 IU/L (ref 0–40)
Albumin: 4.2 g/dL (ref 3.9–4.9)
Alkaline Phosphatase: 102 IU/L (ref 41–116)
BUN/Creatinine Ratio: 22 (ref 9–23)
BUN: 16 mg/dL (ref 6–24)
Bilirubin Total: <0.2 mg/dL (ref 0.0–1.2)
CO2: 24 mmol/L (ref 20–29)
Calcium: 8.9 mg/dL (ref 8.7–10.2)
Chloride: 101 mmol/L (ref 96–106)
Creatinine, Ser: 0.72 mg/dL (ref 0.57–1.00)
Globulin, Total: 3.2 g/dL (ref 1.5–4.5)
Glucose: 99 mg/dL (ref 70–99)
Potassium: 3.6 mmol/L (ref 3.5–5.2)
Sodium: 140 mmol/L (ref 134–144)
Total Protein: 7.4 g/dL (ref 6.0–8.5)
eGFR: 107 mL/min/1.73 (ref >59)

## 2023-12-21 LAB — ESTRADIOL: Estradiol: 34.2 pg/mL

## 2023-12-21 LAB — FOLLICLE STIMULATING HORMONE: FSH: 68.5 m[IU]/mL

## 2023-12-21 LAB — LUTEINIZING HORMONE: LH: 32.6 m[IU]/mL

## 2023-12-21 LAB — PROGESTERONE: Progesterone: <0.1 ng/mL

## 2023-12-21 NOTE — Telephone Encounter (Signed)
 Copied from CRM [id number]. Topic: General - Other >> Dec 21, 2023  9:57 AM Nathanel BROCKS wrote: Reason for CRM: pt came in yesterday to have FMLA paperwork filled out and she needed a fax number for her employer.  That fax number is [phone] attn: HR  Please advise pt when this is faxed over.   Pt had a doctors note for Wednesday and Thursday but with her medical condition she is out today and would like an extended note covering from Wednesday through Friday to return on Monday. If you could please get this done and upload to mychart for pt, that way she doesn't have to come in. Please advise.

## 2023-12-21 NOTE — Telephone Encounter (Signed)
 Letter done to include new dating.   Fax number placed on form.

## 2023-12-21 NOTE — Progress Notes (Signed)
 Hi Judy Marshall, your iron levels are on the low very low end of normal as well as your iron stores.  I would recommend starting an iron supplement if you are not already taking 1.  Iron can be a little constipating so just keep an eye on that and if you need to take a stool softener you can.  Your metabolic panel including liver and kidney function and blood salts is normal.  White count is normal no sign of anemia.  Months do seem to indicate that you might be in menopause plus with you not having had a period for the last year.  If for some reason you start having a period again then let me know and lets plan to recheck hormones at that time but otherwise it does look like you probably are postmenopausal.

## 2023-12-24 ENCOUNTER — Encounter: Payer: Self-pay | Admitting: Family Medicine

## 2023-12-24 NOTE — Telephone Encounter (Signed)
 Sent pt MyChart message advising where she should be able to see the letter that was uploaded to her chart on 12/21/23. Also that as soon as the FMLA paperwork is faxed we will notify her in case she needs a copy. [name], CMA

## 2023-12-25 ENCOUNTER — Telehealth: Payer: Self-pay

## 2023-12-25 NOTE — Telephone Encounter (Unsigned)
 Copied from CRM [id number]. Topic: General - Other >> Dec 21, 2023  9:57 AM Nathanel BROCKS wrote: Reason for CRM: pt came in yesterday to have FMLA paperwork filled out and she needed a fax number for her employer.  That fax number is [phone] attn: HR  Please advise pt when this is faxed over.   Pt had a doctors note for Wednesday and Thursday but with her medical condition she is out today and would like an extended note covering from Wednesday through Friday to return on Monday. If you could please get this done and upload to mychart for pt, that way she doesn't have to come in. Please advise. >> Dec 25, 2023 10:50 AM Antwanette L wrote: The patient is calling to request an update to her work note and FMLA paperwork. She reports that her work excuse is not available in MyChart and is asking for the letter to be resent. The patient can be contacted through MyChart once the updated work excuse and FMLA paperwork are ready

## 2023-12-26 NOTE — Telephone Encounter (Signed)
 Copied from CRM [id number]. Topic: General - Other >> Dec 21, 2023  9:57 AM Nathanel BROCKS wrote: Reason for CRM: pt came in yesterday to have FMLA paperwork filled out and she needed a fax number for her employer.  That fax number is [phone] attn: HR  Please advise pt when this is faxed over.   Pt had a doctors note for Wednesday and Thursday but with her medical condition she is out today and would like an extended note covering from Wednesday through Friday to return on Monday. If you could please get this done and upload to mychart for pt, that way she doesn't have to come in. Please advise. >> Dec 26, 2023 12:28 PM Emylou G wrote: Patient is calling back gain on FMLA paperwork and updated drs note >> Dec 25, 2023 10:50 AM Antwanette L wrote: The patient is calling to request an update to her work note and FMLA paperwork. She reports that her work excuse is not available in MyChart and is asking for the letter to be resent. The patient can be contacted through MyChart once the updated work excuse and FMLA paperwork are ready

## 2024-03-11 ENCOUNTER — Ambulatory Visit: Admitting: Family Medicine

## 2024-04-03 ENCOUNTER — Other Ambulatory Visit: Payer: Self-pay | Admitting: Family Medicine

## 2024-04-03 DIAGNOSIS — E876 Hypokalemia: Secondary | ICD-10-CM

## 2024-04-08 ENCOUNTER — Ambulatory Visit: Admitting: Family Medicine

## 2024-04-08 ENCOUNTER — Encounter: Payer: Self-pay | Admitting: Family Medicine

## 2024-04-08 VITALS — BP 128/66 | HR 69 | Ht 66.0 in | Wt 268.0 lb

## 2024-04-08 DIAGNOSIS — R7301 Impaired fasting glucose: Secondary | ICD-10-CM

## 2024-04-08 DIAGNOSIS — Z1231 Encounter for screening mammogram for malignant neoplasm of breast: Secondary | ICD-10-CM | POA: Diagnosis not present

## 2024-04-08 DIAGNOSIS — I10 Essential (primary) hypertension: Secondary | ICD-10-CM | POA: Diagnosis not present

## 2024-04-08 DIAGNOSIS — E876 Hypokalemia: Secondary | ICD-10-CM | POA: Diagnosis not present

## 2024-04-08 MED ORDER — CHLORTHALIDONE 25 MG PO TABS: 25.0000 mg | ORAL_TABLET | Freq: Every day | ORAL | 1 refills | Status: AC

## 2024-04-08 NOTE — Assessment & Plan Note (Signed)
 Essential hypertension Blood pressure well-controlled.

## 2024-04-08 NOTE — Assessment & Plan Note (Signed)
 Lab Results  Component Value Date   HGBA1C 5.5 09/03/2023   Last A1C at goal.

## 2024-04-08 NOTE — Assessment & Plan Note (Signed)
 Due to recheck potassium

## 2024-04-08 NOTE — Progress Notes (Signed)
 "  Established Patient Office Visit  Patient ID: Judy Marshall, female    DOB: 03/11/1981  Age: 44 y.o. MRN: [id number] PCP: Alvan Dorothyann BIRCH, MD  Chief Complaint  Patient presents with   Hypertension    Subjective:     HPI  Discussed the use of AI scribe software for clinical note transcription with the patient, who gave verbal consent to proceed.  History of Present Illness Judy Marshall is a 44 year old female who presents with amenorrhea and possible premature menopause.  Amenorrhea and menopausal symptoms - Amenorrhea since summer 2024 - Laboratory findings: estrogen 34, FSH 68, LH 32, progesterone  <0.1 - Family history of early menopause: mother in late  thirties, sister (11 years older) currently experiencing menopause - Normal Pap smear in February 2025, at which time amenorrhea had been present for 6-7 months  Musculoskeletal symptoms - Joint pain attributed to hormonal changes - Joint pain managed with stretching exercises - Diet low in sugars and artificial sweeteners, focused on meat and vegetables, perceived to help with joint pain  Hematologic findings - Low iron levels on recent laboratory testing - Iron managed with women's multivitamins every other day to avoid constipation  Lymphatic findings - Small nodule behind ear, decreased in size over time  Constitutional and cardiopulmonary symptoms - No chest pain - No shortness of breath     ROS    Objective:     BP 128/66   Pulse 69   Ht 5' 6 (1.676 m)   Wt 268 lb (121.6 kg)   SpO2 100%   BMI 43.26 kg/m    Physical Exam Vitals and nursing note reviewed.  Constitutional:      Appearance: Normal appearance.  HENT:     Head: Normocephalic and atraumatic.  Eyes:     Conjunctiva/sclera: Conjunctivae normal.  Cardiovascular:     Rate and Rhythm: Normal rate and regular rhythm.  Pulmonary:     Effort: Pulmonary effort is normal.     Breath sounds: Normal breath sounds.   Skin:    General: Skin is warm and dry.     Comments: Small nodule behind the right ear. No skin changes.  Neurological:     Mental Status: She is alert.  Psychiatric:        Mood and Affect: Mood normal.      No results found for any visits on 04/08/24.    The 10-year ASCVD risk score (Arnett DK, et al., 2019) is: 1.2%    Assessment & Plan:   Problem List Items Addressed This Visit       Cardiovascular and Mediastinum   Hypertension goal BP (blood pressure) < 130/80 - Primary    Essential hypertension Blood pressure well-controlled.      Relevant Medications   chlorthalidone  (HYGROTON ) 25 MG tablet   Other Relevant Orders   Fe+TIBC+Fer   Hemoglobin A1c   CMP14+EGFR     Endocrine   IFG (impaired fasting glucose)   Lab Results  Component Value Date   HGBA1C 5.5 09/03/2023   Last A1C at goal.       Relevant Orders   Fe+TIBC+Fer   Hemoglobin A1c   CMP14+EGFR     Other   Hypokalemia   Due to recheck potassium       Other Visit Diagnoses       Encounter for screening mammogram for malignant neoplasm of breast       Relevant Orders   MM 3D SCREENING MAMMOGRAM BILATERAL BREAST  Assessment and Plan Assessment & Plan Premature menopause Confirmed by hormone levels. Family history suggests genetic predisposition. Risk of uterine lining buildup due to amenorrhea. - Referred to GYN for further evaluation and possible hormone level recheck. - Consider ultrasound to assess uterine lining thickness.  Iron deficiency (low iron levels) Iron levels low normal. Monitoring iron absorption and response to supplementation. - Ordered blood work to recheck iron levels. - Encouraged iron-rich diet. - Consider increasing vitamin intake if iron levels remain low.  Sebaceous cyst of scalp, behind ear Sebaceous cyst on scalp, decreased in size, no tenderness or pain. Characteristics suggest sebaceous cyst. - Continue to monitor for changes in size or  symptoms.  General health maintenance Routine health maintenance discussed. - Scheduled mammogram for May.    No follow-ups on file.    Dorothyann Byars, MD [facility]   "

## 2024-04-08 NOTE — Progress Notes (Signed)
 Pt would like to discuss the labs that she had done in September. She stated that her Iron was low and that she started taking a Women's Multivitamin and wanted to know if Dr. Alvan thinks that she should get this rechecked since she has been taking these. Also wanted to discuss her hormone results that were also done that visit also.

## 2024-04-09 ENCOUNTER — Other Ambulatory Visit: Payer: Self-pay | Admitting: Family Medicine

## 2024-04-09 ENCOUNTER — Ambulatory Visit: Payer: Self-pay | Admitting: Family Medicine

## 2024-04-09 DIAGNOSIS — I10 Essential (primary) hypertension: Secondary | ICD-10-CM

## 2024-04-09 LAB — CMP14+EGFR
ALT: 23 IU/L (ref 0–32)
AST: 21 IU/L (ref 0–40)
Albumin: 4.1 g/dL (ref 3.9–4.9)
Alkaline Phosphatase: 81 IU/L (ref 41–116)
BUN/Creatinine Ratio: 24 — ABNORMAL HIGH (ref 9–23)
BUN: 15 mg/dL (ref 6–24)
Bilirubin Total: 0.3 mg/dL (ref 0.0–1.2)
CO2: 28 mmol/L (ref 20–29)
Calcium: 8.9 mg/dL (ref 8.7–10.2)
Chloride: 98 mmol/L (ref 96–106)
Creatinine, Ser: 0.63 mg/dL (ref 0.57–1.00)
Globulin, Total: 2.8 g/dL (ref 1.5–4.5)
Glucose: 106 mg/dL — ABNORMAL HIGH (ref 70–99)
Potassium: 3 mmol/L — ABNORMAL LOW (ref 3.5–5.2)
Sodium: 138 mmol/L (ref 134–144)
Total Protein: 6.9 g/dL (ref 6.0–8.5)
eGFR: 114 mL/min/1.73

## 2024-04-09 LAB — HEMOGLOBIN A1C
Est. average glucose Bld gHb Est-mCnc: 117 mg/dL
Hgb A1c MFr Bld: 5.7 % — ABNORMAL HIGH (ref 4.8–5.6)

## 2024-04-09 LAB — IRON,TIBC AND FERRITIN PANEL
Ferritin: 22 ng/mL (ref 15–150)
Iron Saturation: 9 % — CL (ref 15–55)
Iron: 33 ug/dL (ref 27–159)
Total Iron Binding Capacity: 356 ug/dL (ref 250–450)
UIBC: 323 ug/dL (ref 131–425)

## 2024-04-09 MED ORDER — LOSARTAN POTASSIUM 25 MG PO TABS: 25.0000 mg | ORAL_TABLET | Freq: Every day | ORAL | 1 refills | Status: DC

## 2024-04-09 NOTE — Progress Notes (Signed)
 Hi Judy Marshall, iron interestingly is almost exactly the same.  It is just a little unusual to not have seen at least a small shift.  I know you had mentioned that you are taking a multivitamin with iron.  Would you consider switching to just an iron tab.  It has a little bit more elemental iron in it.  You may have to take it with a softener daily as well as maybe even using the MiraLAX periodically but I want to see if we can get that number shifted just slightly.  Your A1c is still in the prediabetes range.  Kidney function is stable and looks great.  Liver enzymes are normal as well.  Your potassium is dropped again similar to about a year ago.  Are you still taking your potassium regularly?  Do you need refills?

## 2024-04-11 ENCOUNTER — Encounter: Payer: Self-pay | Admitting: Family Medicine

## 2024-04-16 ENCOUNTER — Other Ambulatory Visit: Payer: Self-pay | Admitting: *Deleted

## 2024-04-16 DIAGNOSIS — I10 Essential (primary) hypertension: Secondary | ICD-10-CM

## 2024-04-16 MED ORDER — LOSARTAN POTASSIUM 25 MG PO TABS: 25.0000 mg | ORAL_TABLET | Freq: Every day | ORAL | 1 refills | Status: AC
